# Patient Record
Sex: Male | Born: 1970
Health system: Southern US, Community
[De-identification: ages and names within clinical notes are randomized; demographics above are authoritative.]

---

## 2007-05-23 ENCOUNTER — Other Ambulatory Visit: Payer: Self-pay

## 2007-05-23 ENCOUNTER — Emergency Department: Payer: Self-pay | Admitting: Emergency Medicine

## 2012-04-06 ENCOUNTER — Emergency Department: Payer: Self-pay | Admitting: Emergency Medicine

## 2014-04-25 ENCOUNTER — Encounter: Payer: Self-pay | Admitting: *Deleted

## 2014-05-01 ENCOUNTER — Emergency Department
Admit: 2014-05-01 | Disposition: A | Discharge status: Home / Self Care | Payer: Self-pay | Admitting: Emergency Medicine

## 2014-05-03 ENCOUNTER — Inpatient Hospital Stay
Admit: 2014-05-03 | Disposition: A | Discharge status: Home / Self Care | Payer: Self-pay | Attending: Internal Medicine | Admitting: Internal Medicine

## 2014-05-03 LAB — COMPREHENSIVE METABOLIC PANEL
ALBUMIN: 4.3 g/dL
ALK PHOS: 62 U/L
ALT: 14 U/L — AB
ANION GAP: 10 (ref 7–16)
BUN: 13 mg/dL
Bilirubin,Total: 0.7 mg/dL
CALCIUM: 9.6 mg/dL
CHLORIDE: 99 mmol/L — AB
Co2: 28 mmol/L
Creatinine: 1 mg/dL
EGFR (African American): >60
GLUCOSE: 111 mg/dL — AB
Potassium: 4.3 mmol/L
SGOT(AST): 20 U/L
SODIUM: 137 mmol/L
Total Protein: 8.1 g/dL

## 2014-05-03 LAB — CBC WITH DIFFERENTIAL/PLATELET
BASOS ABS: 0 10*3/uL (ref 0.0–0.1)
Basophil %: 0.2 %
EOS ABS: 0.1 10*3/uL (ref 0.0–0.7)
EOS PCT: 0.7 %
HCT: 44.9 % (ref 40.0–52.0)
HGB: 14.9 g/dL (ref 13.0–18.0)
Lymphocyte #: 3.2 10*3/uL (ref 1.0–3.6)
Lymphocyte %: 17.6 %
MCH: 31.6 pg (ref 26.0–34.0)
MCHC: 33.2 g/dL (ref 32.0–36.0)
MCV: 95 fL (ref 80–100)
MONOS PCT: 7.3 %
Monocyte #: 1.3 x10 3/mm — ABNORMAL HIGH (ref 0.2–1.0)
NEUTROS ABS: 13.5 10*3/uL — AB (ref 1.4–6.5)
Neutrophil %: 74.2 %
Platelet: 230 10*3/uL (ref 150–440)
RBC: 4.71 10*6/uL (ref 4.40–5.90)
RDW: 13.2 % (ref 11.5–14.5)
WBC: 18.3 10*3/uL — ABNORMAL HIGH (ref 3.8–10.6)

## 2014-05-08 LAB — CULTURE, BLOOD (SINGLE)

## 2014-05-21 NOTE — H&P (Signed)
PATIENT NAME:  Benjamin Hill, Benjamin Hill MR#:  [id number] DATE OF BIRTH:  January 27, 1970  DATE OF ADMISSION:  05/03/2014  REFERRING PHYSICIAN:  Darci Current, MD   PRIMARY CARE PHYSICIAN:  Nonlocal.   ADMISSION DIAGNOSIS: Gluteal abscess.   HISTORY OF PRESENT ILLNESS: This is a 44 year old Caucasian male who presents to the Emergency Department complaining of a painful wound on his right gluteal cheek. The patient was seen in the Emergency Department a few days ago for the same and he received some oral doxycycline that he has been taking but today felt mildly nauseous briefly as well as some sweating and chills. The lesion on his bottom has become slightly more painful as well which prompted him to come to the Emergency Department.   Laboratory evaluation there revealed insignificant leukocytosis and enlarged area of cellulitis and abscess which prompted the Emergency Department to call for admission.   REVIEW OF SYSTEMS:  CONSTITUTIONAL: Patient admits to subjective fever but denies weakness.  EYES:  Denies blurred vision or inflammation.  EARS, NOSE AND THROAT: Denies tinnitus or sore throat.  RESPIRATORY: Denies cough or shortness of breath.  CARDIOVASCULAR: Denies chest pain, palpitations, or paroxysmal nocturnal dyspnea.  GASTROINTESTINAL: Admits to 1 episode of nausea but denies vomiting, abdominal pain, or diarrhea.  GENITOURINARY: Denies dysuria, increased frequency, or hesitancy of urination.  ENDOCRINE: Denies polyuria or polydipsia.  HEMATOLOGIC AND LYMPHATIC: Denies easy bruising or bleeding.  MUSCULOSKELETAL: Denies arthralgias or myalgias.  ENDOCRINE: Denies polyuria or polydipsia.  NEUROLOGIC: Denies numbness in his extremities or dysarthria.  PSYCHIATRIC: Denies depression or suicidal ideation.   PAST MEDICAL HISTORY: The patient has no chronic medical illnesses.   PAST SURGICAL HISTORY: The patient has not undergone any surgeries.   SOCIAL HISTORY: The patient is married, has  children who are in good health. He is actively trying to quit smoking by using Chantix in the past. He does not drink or do any drugs.   FAMILY HISTORY: Significant for blood pressure in sporadic members in his family. One of his parents is  deceased of lung cancer and other battling the same condition right now.     MEDICATIONS: The patient currently takes no medications.   ALLERGIES: No known drug allergies.   PERTINENT LABORATORY RESULTS AND RADIOGRAPHIC FINDINGS: Serum glucose is 111, BUN 13, creatinine 1, sodium is 137, potassium 4.3, chloride is 99, bicarbonate is 28, calcium is 9.6, serum albumin is 4.3, bilirubin 0.7, alkaline phosphatase 62, AST 20, ALT is 14; white blood cell count is 18.3, hemoglobin is 14.9, hematocrit 44.9, platelet count 230,000; MCV is 95. Ultrasound results of soft tissue cellulitis and abscess, shows interstitial edema with suspected cellulitis without significant abscess.     PHYSICAL EXAMINATION: VITAL SIGNS: Temperature is 97.8, pulse is 86, respirations 20, blood pressure is 126/80; pulse oximetry is 98% on room air.  GENERAL: The patient is alert and oriented x 3, in no current distress.  HEENT: Normocephalic, atraumatic, pupils equal, round, and reactive to light and accommodation. Extraocular movements are intact. Mucous membranes are moist.  NECK: Trachea is midline. No adenopathy. Thyroid is nonpalpable, not tender.  CHEST: Symmetric and atraumatic.  CARDIOVASCULAR: Regular rate and rhythm, normal S1, S2, no rubs, clicks, or murmurs appreciated.  LUNGS: Clear to auscultation bilaterally, normal effort and excursion. ABDOMEN: Positive bowel sounds, soft, nontender, nondistended, no hepatosplenomegaly.    GENITOURINARY: Deferred.  MUSCULOSKELETAL: The patient moves all 4 extremities equally; there is 5 out of 5 strength in upper and lower extremities  bilaterally.  SKIN: Warm and dry. The patient has an area of approximately 3.5 cm in diameter on his  right lateral gluteus with a small puncta in the middle that is hot and tender to touch, it is nonfluctuant.  EXTREMITIES: No clubbing, cyanosis, or edema.  NEUROLOGIC: Cranial nerves II through XII are grossly intact.  PSYCHIATRIC: Mood is normal. Affect is congruent. The patient has excellent insight and judgment into his medical condition.   ASSESSMENT AND PLAN: This is a 44 year old male admitted for gluteal cellulitis and early abscess.  1.  Abscess is not yet fluctuant.  Plan is to apply warm compresses. The patient has been given vancomycin the Emergency Department. We will continue to follow sensitivities from blood cultures obtained.  2.  Sepsis. The patient meets criteria via heart rate and leukocytosis. He is hemodynamically stable and we will follow blood culture results.  3.  Deep vein thrombosis prophylaxis, Lovenox.  4.  Gastrointestinal prophylaxis, unnecessary as the patient is not critically ill.   CODE STATUS: The patient is a full code.   Time spent on admission orders and patient care is approximately 35 minutes.    ____________________________ Kelton PillarMichael S. Sheryle Hailiamond, MD msd:nt D: 05/03/2014 17:33:14 ET T: 05/03/2014 18:44:14 ET JOB#: [id number]  cc: Kelton PillarMichael S. Sheryle Hailiamond, MD, <Dictator> Kelton PillarMICHAEL S Johncharles Fusselman MD ELECTRONICALLY SIGNED 05/10/2014 9:45

## 2014-05-21 NOTE — Discharge Summary (Signed)
PATIENT NAME:  Benjamin Hill, Richad A MR#:  [id number] DATE OF BIRTH:  1970-09-09  DATE OF ADMISSION:  05/03/2014 DATE OF DISCHARGE:  05/04/2014  PRESENTING COMPLAINT: Increased redness, erythema and pain on the right thigh.  DISCHARGE DIAGNOSIS: Right gluteus/thigh cellulitis.   CODE STATUS: FULL.  DISCHARGE MEDICATIONS: 1.  Percocet 5/325 one every 8 hours as needed.  2.  Bactrim DS 1 tablet b.i.d.   DISCHARGE INSTRUCTIONS: Follow up with urgent care or ER as needed.   DIAGNOSTIC DATA: Blood cultures negative in 48 hours.   Ultrasound, soft tissue, showed interstitial edema, suspect cellulitis without abscess.  White count 18,000. Chemistry within normal limits.   BRIEF SUMMARY OF HOSPITAL COURSE: Rod MaeCharles Florendo is a 44 year old Caucasian gentleman with no past medical history who comes to the Emergency Room with increasing right gluteal erythema. He was admitted with:  1.  Right gluteal/thigh erythema due to cellulitis. No evidence of abscess. IV antibiotics with vancomycin and Unasyn was started, changed to p.o. Bactrim DS b.i.d.  2.  Sepsis. Due to #1. Resolved. Blood cultures negative.   Overall hospital stay otherwise remained stable. The patient remained a FULL code.   TIME SPENT: 40 minutes.  ____________________________ Wylie HailSona A. Allena KatzPatel, MD sap:sb D: 05/05/2014 07:03:31 ET T: 05/05/2014 12:31:19 ET JOB#: [id number]  cc: Tomislav Micale A. Allena KatzPatel, MD, <Dictator> Willow OraSONA A Harsimran Westman MD ELECTRONICALLY SIGNED 05/10/2014 10:33

## 2016-10-11 IMAGING — US US SOFT TISSUE EXCLUDE HEAD/NECK
1 series · 14 of 25 positions shown · non-contrast
Comparison: None.

CLINICAL DATA: Cellulitis, concern for abscess. Symptoms began
after a pinching sensation 5 days ago.

EXAM:
US SOFT TISSUE EXCLUDE HEAD/NECK
TECHNIQUE: Ultrasound examination of the gluteal soft tissues was performed in
the area of clinical concern.

[Series 1: us soft tissue exclude head/neck · 0.06mm/px · 14 of 58 slices shown]
[im 1/58]
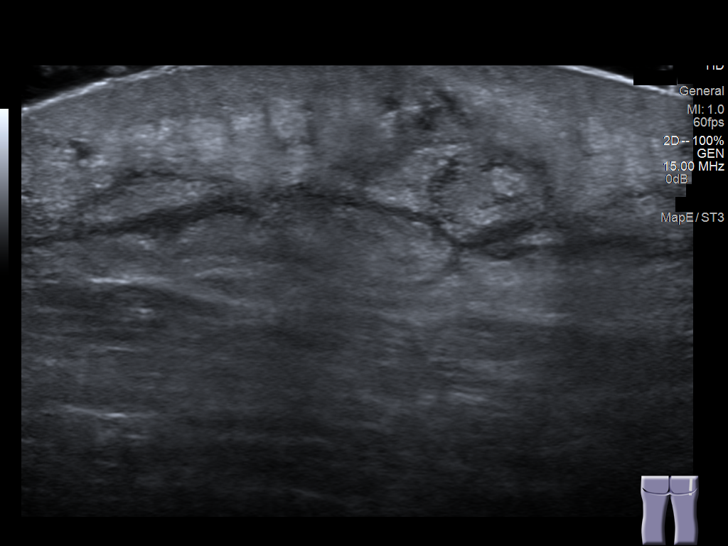
[im 5/58]
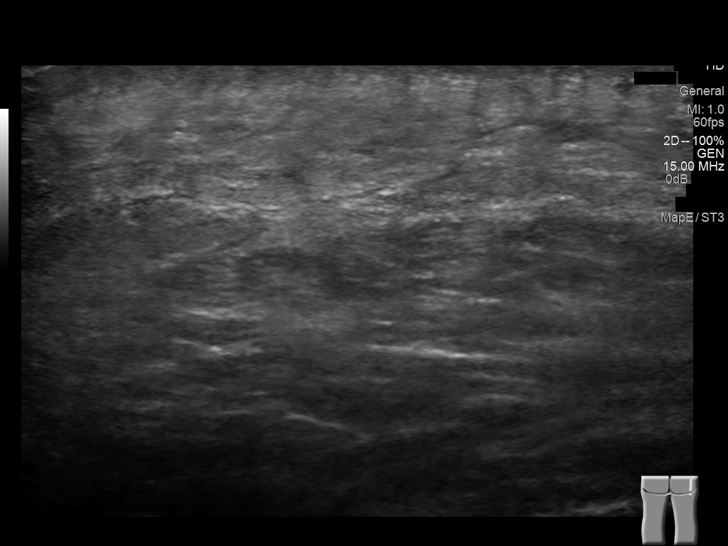
[im 10/58]
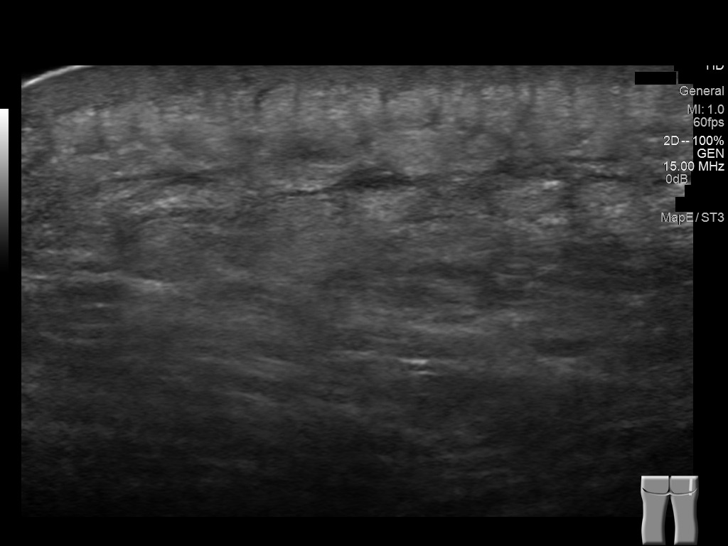
[im 15/58]
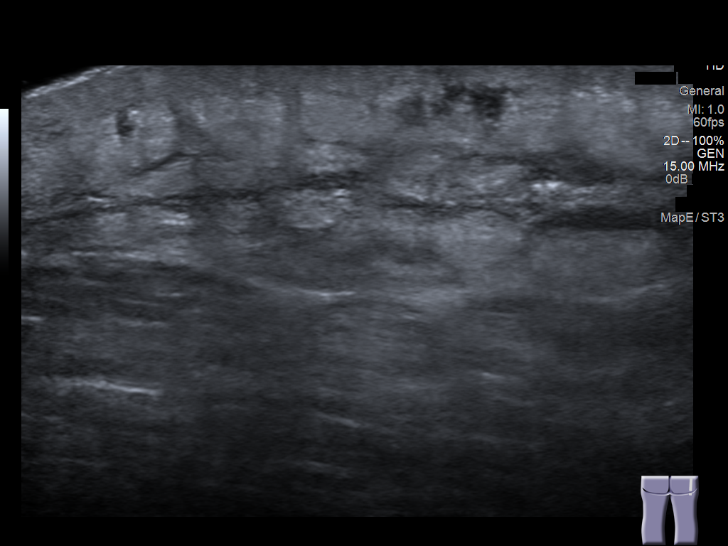
[im 20/58]
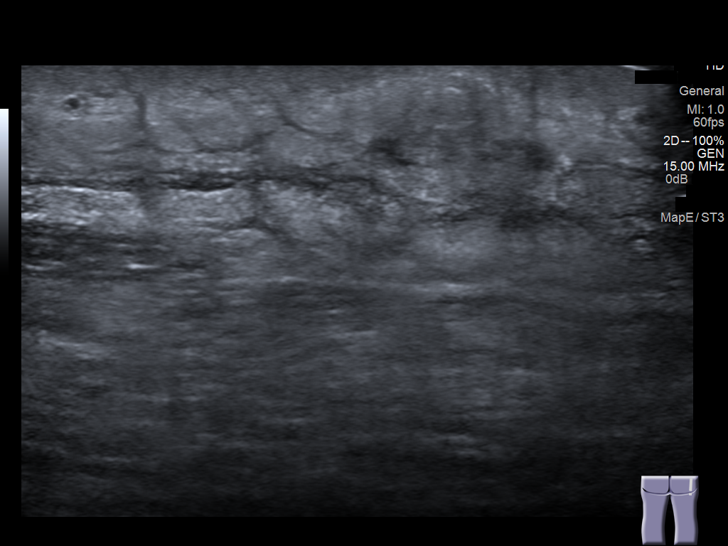
[im 22/58]
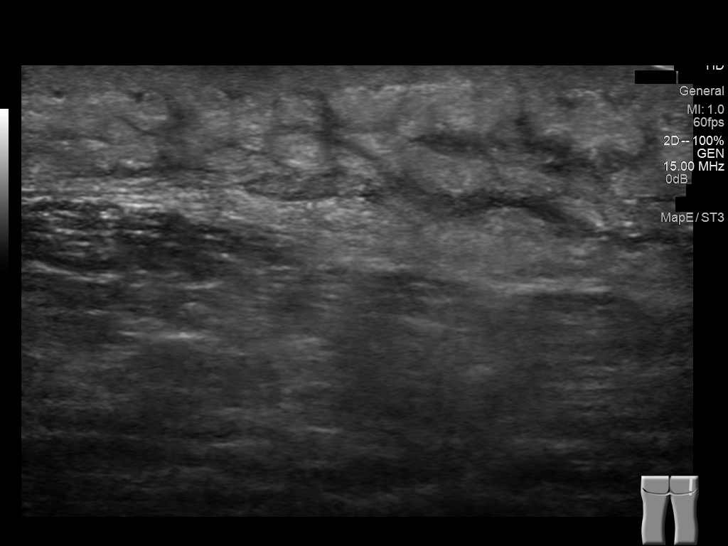
[im 27/58]
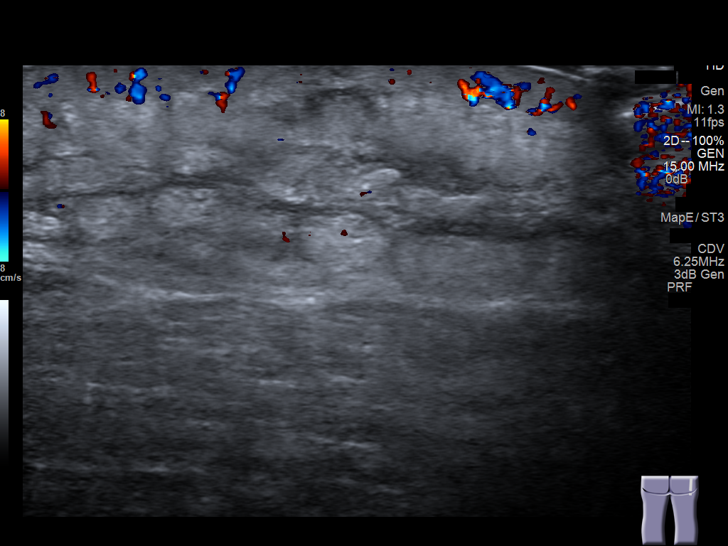
[im 31/58]
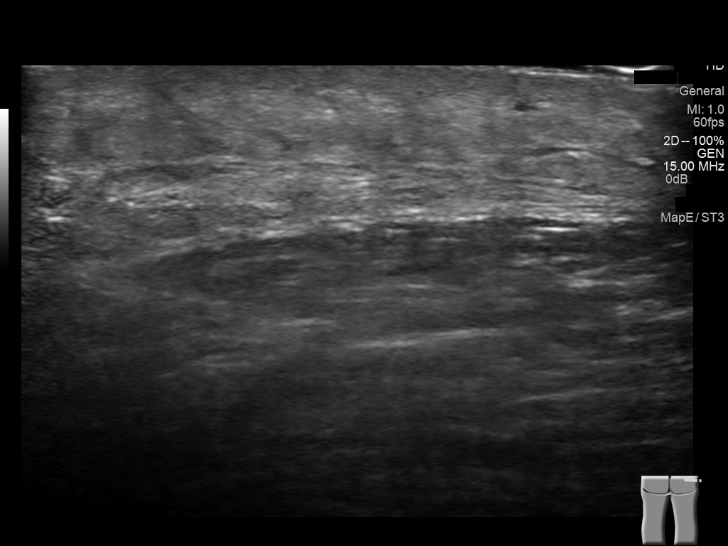
[im 36/58]
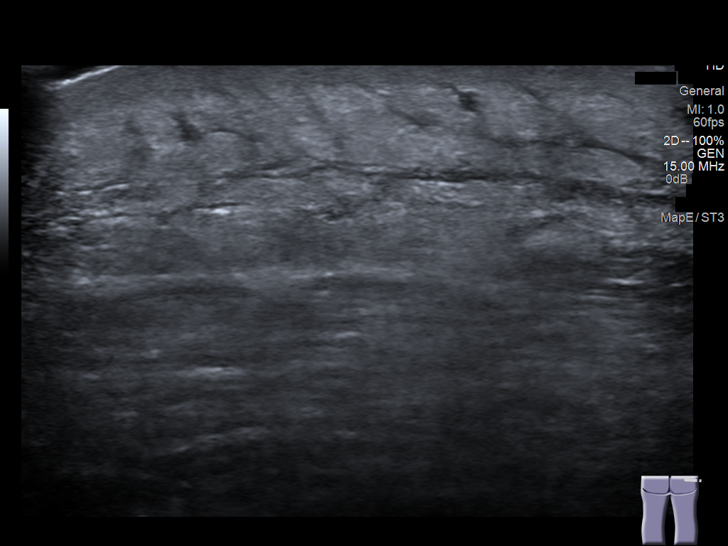
[im 39/58]
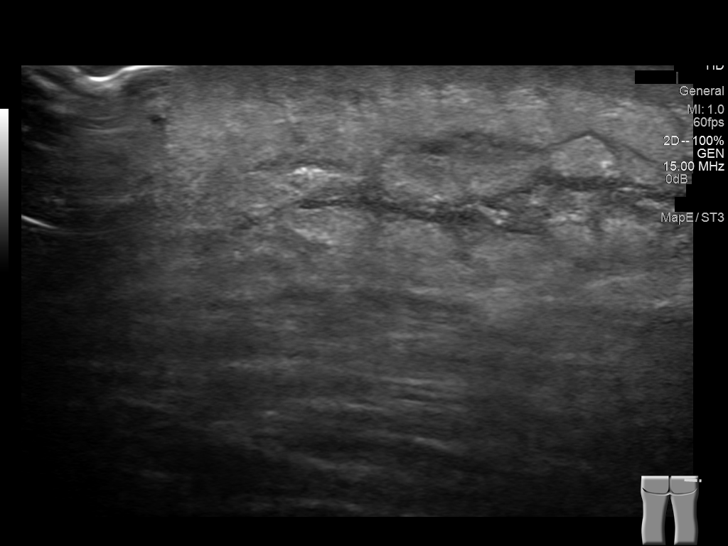
[im 43/58]
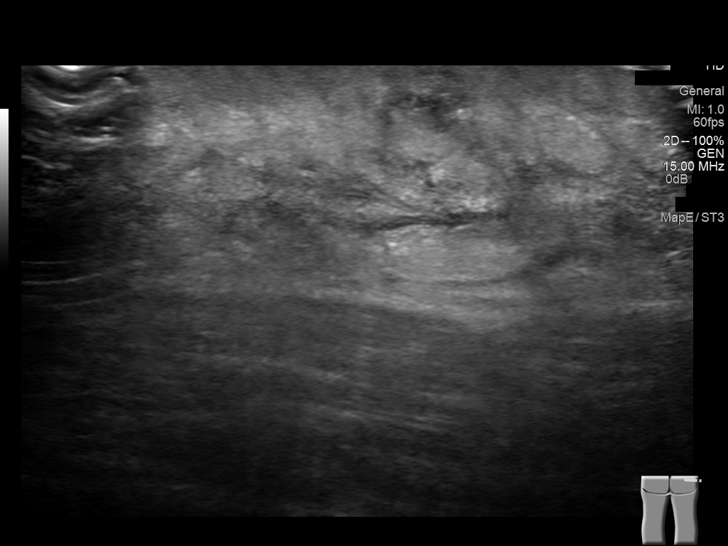
[im 48/58]
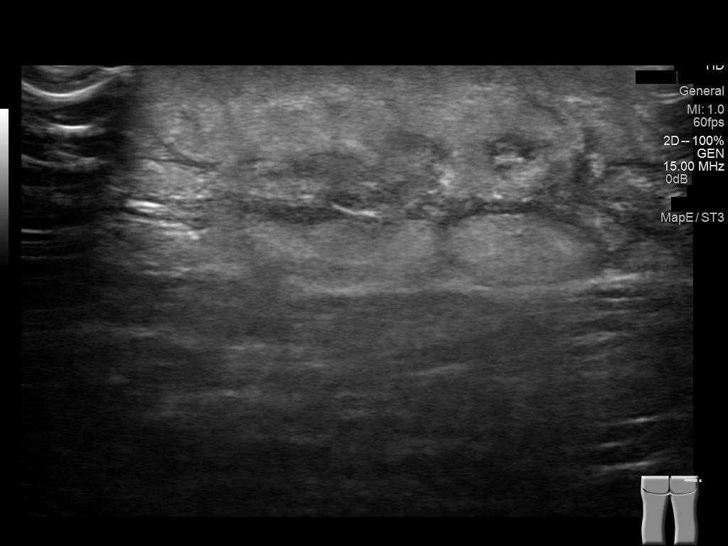
[im 53/58]
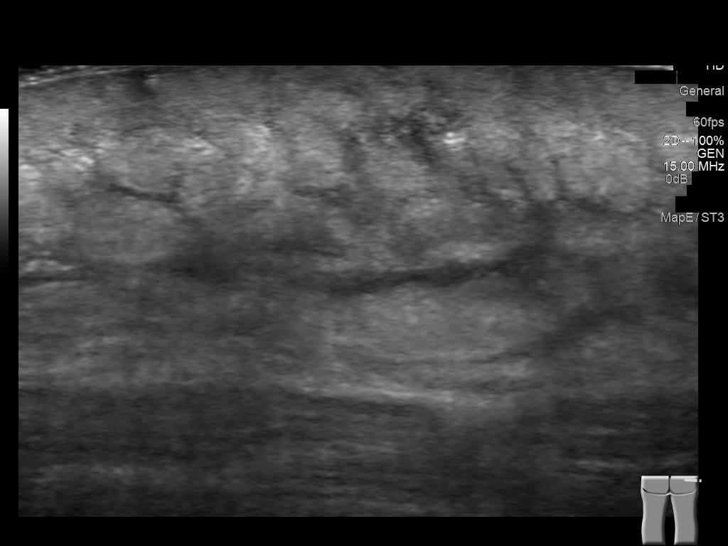
[im 58/58]
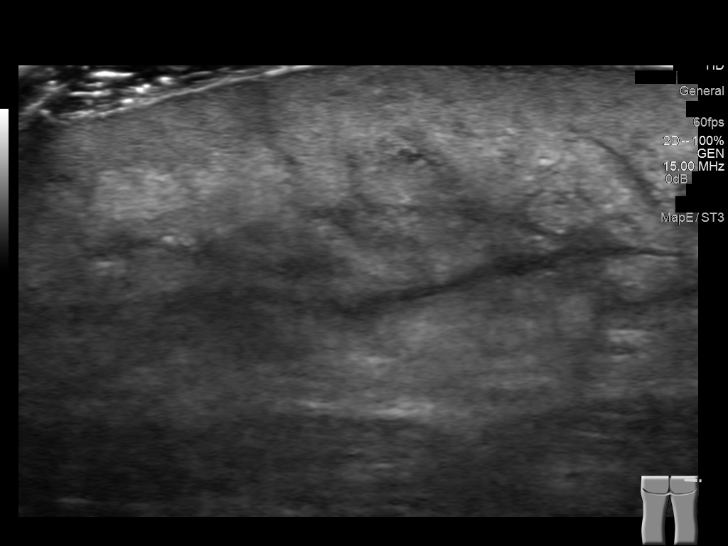

[14 of 25 positions shown; findings below may reference images not displayed]

FINDINGS: Interstitial edema within the RIGHT gluteal soft tissues in area of
concern without focal fluid collection. Mildly increased
vascularity. No echogenic foci to suggest radiopaque foreign bodies
or gas.
IMPRESSION: Interstitial edema/ suspected cellulitis without abscess.

By: Luna Aidoo

## 2018-03-30 ENCOUNTER — Encounter (HOSPITAL_COMMUNITY): Payer: Self-pay | Admitting: *Deleted

## 2018-03-30 ENCOUNTER — Emergency Department (HOSPITAL_COMMUNITY)
Admission: EM | Admit: 2018-03-30 | Discharge: 2018-03-30 | Disposition: A | Discharge status: Home / Self Care | Payer: No Typology Code available for payment source | Attending: Emergency Medicine | Admitting: Emergency Medicine

## 2018-03-30 ENCOUNTER — Other Ambulatory Visit: Payer: Self-pay

## 2018-03-30 DIAGNOSIS — F332 Major depressive disorder, recurrent severe without psychotic features: Secondary | ICD-10-CM | POA: Insufficient documentation

## 2018-03-30 DIAGNOSIS — F320 Major depressive disorder, single episode, mild: Secondary | ICD-10-CM

## 2018-03-30 DIAGNOSIS — F17210 Nicotine dependence, cigarettes, uncomplicated: Secondary | ICD-10-CM | POA: Diagnosis not present

## 2018-03-30 LAB — COMPREHENSIVE METABOLIC PANEL
ALT: 14 U/L (ref 0–44)
AST: 21 U/L (ref 15–41)
Albumin: 4.6 g/dL (ref 3.5–5.0)
Alkaline Phosphatase: 61 U/L (ref 38–126)
Anion gap: 11 (ref 5–15)
BUN: 11 mg/dL (ref 6–20)
CO2: 23 mmol/L (ref 22–32)
Calcium: 9.5 mg/dL (ref 8.9–10.3)
Chloride: 103 mmol/L (ref 98–111)
Creatinine, Ser: 0.85 mg/dL (ref 0.61–1.24)
GFR calc Af Amer: >60 mL/min (ref >60)
GFR calc non Af Amer: >60 mL/min (ref >60)
GLUCOSE: 82 mg/dL (ref 70–99)
POTASSIUM: 3.7 mmol/L (ref 3.5–5.1)
Sodium: 137 mmol/L (ref 135–145)
Total Bilirubin: 0.5 mg/dL (ref 0.3–1.2)
Total Protein: 8.3 g/dL — ABNORMAL HIGH (ref 6.5–8.1)

## 2018-03-30 LAB — CBC WITH DIFFERENTIAL/PLATELET
Abs Immature Granulocytes: 0.03 10*3/uL (ref 0.00–0.07)
Basophils Absolute: 0 10*3/uL (ref 0.0–0.1)
Basophils Relative: 0 %
EOS ABS: 0.2 10*3/uL (ref 0.0–0.5)
Eosinophils Relative: 2 %
HEMATOCRIT: 45 % (ref 39.0–52.0)
Hemoglobin: 14.7 g/dL (ref 13.0–17.0)
IMMATURE GRANULOCYTES: 0 %
LYMPHS ABS: 3.7 10*3/uL (ref 0.7–4.0)
Lymphocytes Relative: 35 %
MCH: 31.1 pg (ref 26.0–34.0)
MCHC: 32.7 g/dL (ref 30.0–36.0)
MCV: 95.1 fL (ref 80.0–100.0)
Monocytes Absolute: 0.7 10*3/uL (ref 0.1–1.0)
Monocytes Relative: 6 %
NEUTROS PCT: 57 %
NRBC: 0 % (ref 0.0–0.2)
Neutro Abs: 6.1 10*3/uL (ref 1.7–7.7)
Platelets: 331 10*3/uL (ref 150–400)
RBC: 4.73 MIL/uL (ref 4.22–5.81)
RDW: 13.5 % (ref 11.5–15.5)
WBC: 10.8 10*3/uL — ABNORMAL HIGH (ref 4.0–10.5)

## 2018-03-30 LAB — ETHANOL: Alcohol, Ethyl (B): <10 mg/dL (ref <10)

## 2018-03-30 MED ORDER — AMITRIPTYLINE HCL 50 MG PO TABS: ORAL_TABLET | ORAL | 1 refills | Status: AC

## 2018-03-30 NOTE — ED Triage Notes (Signed)
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 Pt denies any SI/HI just states he feels depressed; pt states he is having trouble sleeping and eating
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
## 2018-03-30 NOTE — ED Provider Notes (Addendum)
[facility] EMERGENCY DEPARTMENT Provider Note   CSN: [id number] Arrival date & time: 03/30/18  1902    History   Chief Complaint Chief Complaint  Patient presents with  . Medical Clearance    HPI Benjamin Hill. is a 48 y.o. male.     Patient presents stating that he feels very stressed depressed and anxious.  He has not been sleeping or eating much for over a week.  The history is provided by the patient. No language interpreter was used.  Altered Mental Status  Presenting symptoms: no disorientation   Severity:  Moderate Most recent episode:  More than 2 days ago Episode history:  Single Timing:  Constant Progression:  Worsening Chronicity:  New Context: not alcohol use   Associated symptoms: no abdominal pain, no hallucinations, no headaches, no rash and no seizures     History reviewed. No pertinent past medical history.  There are no active problems to display for this patient.   History reviewed. No pertinent surgical history.      Home Medications    Prior to Admission medications   Not on File    Family History History reviewed. No pertinent family history.  Social History Social History   Tobacco Use  . Smoking status: Current Every Day Smoker    Packs/day: 1.00    Types: Cigarettes  . Smokeless tobacco: Never Used  Substance Use Topics  . Alcohol use: Not Currently  . Drug use: Not Currently     Allergies   Patient has no known allergies.   Review of Systems Review of Systems  Constitutional: Negative for appetite change and fatigue.  HENT: Negative for congestion, ear discharge and sinus pressure.   Eyes: Negative for discharge.  Respiratory: Negative for cough.   Cardiovascular: Negative for chest pain.  Gastrointestinal: Negative for abdominal pain and diarrhea.  Genitourinary: Negative for frequency and hematuria.  Musculoskeletal: Negative for back pain.  Skin: Negative for rash.  Neurological: Negative for  seizures and headaches.  Psychiatric/Behavioral: Positive for dysphoric mood. Negative for hallucinations.     Physical Exam Updated Vital Signs BP (!) 149/93 (BP Location: Right Arm)   Pulse 82   Temp 98.3 F (36.8 C) (Oral)   Resp 17   Ht 5\' 10"  (1.778 m)   Wt 68 kg   SpO2 97%   BMI 21.52 kg/m   Physical Exam Vitals signs and nursing note reviewed.  Constitutional:      Appearance: He is well-developed.  HENT:     Head: Normocephalic.     Nose: Nose normal.  Eyes:     General: No scleral icterus.    Conjunctiva/sclera: Conjunctivae normal.  Neck:     Musculoskeletal: Neck supple.     Thyroid: No thyromegaly.  Cardiovascular:     Rate and Rhythm: Normal rate and regular rhythm.     Heart sounds: No murmur. No friction rub. No gallop.   Pulmonary:     Breath sounds: No stridor. No wheezing or rales.  Chest:     Chest wall: No tenderness.  Abdominal:     General: There is no distension.     Tenderness: There is no abdominal tenderness. There is no rebound.  Musculoskeletal: Normal range of motion.  Lymphadenopathy:     Cervical: No cervical adenopathy.  Skin:    Findings: No erythema or rash.  Neurological:     Mental Status: He is oriented to person, place, and time.     Motor: No abnormal  muscle tone.     Coordination: Coordination normal.  Psychiatric:     Comments: Patient depressed anxious but not suicidal or homicidal      ED Treatments / Results  Labs (all labs ordered are listed, but only abnormal results are displayed) Labs Reviewed  CBC WITH DIFFERENTIAL/PLATELET  COMPREHENSIVE METABOLIC PANEL  ETHANOL  RAPID URINE DRUG SCREEN, HOSP PERFORMED    EKG None  Radiology No results found.  Procedures Procedures (including critical care time)  Medications Ordered in ED Medications - No data to display   Initial Impression / Assessment and Plan / ED Course  I have reviewed the triage vital signs and the nursing notes.  Pertinent labs  & imaging results that were available during my care of the patient were reviewed by me and considered in my medical decision making (see chart for details).   Patient depressed and anxious he will be evaluated by behavioral health   Patient was seen by behavioral health and it was felt he could be treated as an outpatient for his depression.  He will be given some Elavil to help with his sleep and depression and follow-up with outpatient treatment  Final Clinical Impressions(s) / ED Diagnoses   Final diagnoses:  None    ED Discharge Orders    None       Bethann Berkshire, MD 03/30/18 2014    Bethann Berkshire, MD 03/30/18 2313

## 2018-03-30 NOTE — Discharge Instructions (Signed)
Follow up with out patient behavior health as instructed by the behavior health person

## 2018-03-30 NOTE — ED Notes (Signed)
Updated pt on delay of waiting on outpatient information

## 2018-04-15 NOTE — Progress Notes (Deleted)
Psychiatric Initial Adult Assessment   Patient Identification: Benjamin Hill. MRN:  [id number] Date of Evaluation:  04/15/2018 Referral Source: *** Chief Complaint:   Visit Diagnosis: No diagnosis found.  History of Present Illness:   Benjamin Huyett. is a 48 y.o. year old male with a history of depression, who is referred for   amitriptyline   Associated Signs/Symptoms: Depression Symptoms:  {DEPRESSION SYMPTOMS:20000} (Hypo) Manic Symptoms:  {BHH MANIC SYMPTOMS:22872} Anxiety Symptoms:  {BHH ANXIETY SYMPTOMS:22873} Psychotic Symptoms:  {BHH PSYCHOTIC SYMPTOMS:22874} PTSD Symptoms: {BHH PTSD SYMPTOMS:22875}  Past Psychiatric History:  Outpatient:  Psychiatry admission:  Previous suicide attempt:  Past trials of medication:  History of violence:   Previous Psychotropic Medications: {YES/NO:21197}  Substance Abuse History in the last 12 months:  {yes no:314532}  Consequences of Substance Abuse: {BHH CONSEQUENCES OF SUBSTANCE ABUSE:22880}  Past Medical History: No past medical history on file. No past surgical history on file.  Family Psychiatric History: ***  Family History: No family history on file.  Social History:   Social History   Socioeconomic History  . Marital status: Married    Spouse name: Not on file  . Number of children: Not on file  . Years of education: Not on file  . Highest education level: Not on file  Occupational History  . Not on file  Social Needs  . Financial resource strain: Not on file  . Food insecurity:    Worry: Not on file    Inability: Not on file  . Transportation needs:    Medical: Not on file    Non-medical: Not on file  Tobacco Use  . Smoking status: Current Every Day Smoker    Packs/day: 1.00    Types: Cigarettes  . Smokeless tobacco: Never Used  Substance and Sexual Activity  . Alcohol use: Not Currently  . Drug use: Not Currently  . Sexual activity: Not on file  Lifestyle  . Physical  activity:    Days per week: Not on file    Minutes per session: Not on file  . Stress: Not on file  Relationships  . Social connections:    Talks on phone: Not on file    Gets together: Not on file    Attends religious service: Not on file    Active member of club or organization: Not on file    Attends meetings of clubs or organizations: Not on file    Relationship status: Not on file  Other Topics Concern  . Not on file  Social History Narrative  . Not on file    Additional Social History: ***  Allergies:  No Known Allergies  Metabolic Disorder Labs: No results found for: HGBA1C, MPG No results found for: PROLACTIN No results found for: CHOL, TRIG, HDL, CHOLHDL, VLDL, LDLCALC No results found for: TSH  Therapeutic Level Labs: No results found for: LITHIUM No results found for: CBMZ No results found for: VALPROATE  Current Medications: Current Outpatient Medications  Medication Sig Dispense Refill  . amitriptyline (ELAVIL) 50 MG tablet Take one po qhs 30 tablet 1   No current facility-administered medications for this visit.     Musculoskeletal: Strength & Muscle Tone: within normal limits Gait & Station: normal Patient leans: N/A  Psychiatric Specialty Exam: ROS  There were no vitals taken for this visit.There is no height or weight on file to calculate BMI.  General Appearance: Fairly Groomed  Eye Contact:  Good  Speech:  Clear and Coherent  Volume:  Normal  Mood:  {BHH MOOD:22306}  Affect:  {Affect (PAA):22687}  Thought Process:  Coherent  Orientation:  Full (Time, Place, and Person)  Thought Content:  Logical  Suicidal Thoughts:  {ST/HT (PAA):22692}  Homicidal Thoughts:  {ST/HT (PAA):22692}  Memory:  Immediate;   Good  Judgement:  {Judgement (PAA):22694}  Insight:  {Insight (PAA):22695}  Psychomotor Activity:  Normal  Concentration:  Concentration: Good and Attention Span: Good  Recall:  Good  Fund of Knowledge:Good  Language: Good  Akathisia:   No  Handed:  Right  AIMS (if indicated):  not done  Assets:  Communication Skills Desire for Improvement  ADL's:  Intact  Cognition: WNL  Sleep:  {BHH GOOD/FAIR/POOR:22877}   Screenings:   Assessment and Plan:  Assessment  Plan  The patient demonstrates the following risk factors for suicide: Chronic risk factors for suicide include: {Chronic Risk Factors for KDXIPJA:25053976}. Acute risk factors for suicide include: {Acute Risk Factors for BHALPFX:90240973}. Protective factors for this patient include: {Protective Factors for Suicide ZHGD:92426834}. Considering these factors, the overall suicide risk at this point appears to be {Desc; low/moderate/high:110033}. Patient {ACTION; IS/IS HDQ:22297989} appropriate for outpatient follow up.     Neysa Hotter, MD 3/26/202011:35 AM

## 2018-04-21 ENCOUNTER — Ambulatory Visit (HOSPITAL_COMMUNITY): Payer: No Typology Code available for payment source | Admitting: Psychiatry

## 2018-04-21 ENCOUNTER — Other Ambulatory Visit: Payer: Self-pay

## 2018-04-21 ENCOUNTER — Encounter

## 2019-03-01 ENCOUNTER — Ambulatory Visit (INDEPENDENT_AMBULATORY_CARE_PROVIDER_SITE_OTHER): Payer: No Typology Code available for payment source

## 2019-03-01 ENCOUNTER — Ambulatory Visit
Admission: EM | Admit: 2019-03-01 | Discharge: 2019-03-01 | Disposition: A | Discharge status: Home / Self Care | Payer: No Typology Code available for payment source | Attending: Emergency Medicine | Admitting: Emergency Medicine

## 2019-03-01 ENCOUNTER — Other Ambulatory Visit: Payer: Self-pay

## 2019-03-01 DIAGNOSIS — Z87828 Personal history of other (healed) physical injury and trauma: Secondary | ICD-10-CM

## 2019-03-01 DIAGNOSIS — S4990XA Unspecified injury of shoulder and upper arm, unspecified arm, initial encounter: Secondary | ICD-10-CM | POA: Diagnosis not present

## 2019-03-01 DIAGNOSIS — M25512 Pain in left shoulder: Secondary | ICD-10-CM | POA: Diagnosis not present

## 2019-03-01 MED ORDER — PREDNISONE 10 MG PO TABS: 20.0000 mg | ORAL_TABLET | Freq: Every day | ORAL | 0 refills | Status: AC

## 2019-03-01 MED ORDER — IBUPROFEN 800 MG PO TABS: 800.0000 mg | ORAL_TABLET | Freq: Three times a day (TID) | ORAL | 0 refills | Status: AC

## 2019-03-01 NOTE — Discharge Instructions (Addendum)
Rest, ice and heat as needed Ensure adequate ROM as tolerated. Prescribed ibuprofen as needed for inflammation and pain relief Prescribed Short-term prednisone Follow-up with primary care for referral to orthopedic Return here or go to ER if you have any new or worsening symptoms such as numbness/tingling of the inner thighs, loss of bladder or bowel control, headache/blurry vision, nausea/vomiting, confusion/altered mental status, dizziness, weakness, passing out, imbalance, etc..Marland Kitchen

## 2019-03-01 NOTE — ED Triage Notes (Signed)
Pt states that he was helping his son move furniture today. His son dropped his side of the couch leaving him being the only one holding it. The patient states he felt a lot of pain in his left shoulder when that happened. Reports previous dislocation in this shoulder 25 years ago. Reports increased pain with movement.

## 2019-03-01 NOTE — ED Provider Notes (Signed)
[facility]    CSN: [id number] Arrival date & time: 03/01/19  1627      History   Chief Complaint Chief Complaint  Patient presents with  . Shoulder Injury    HPI Benjamin Hill. is a 49 y.o. male.   Who presents to the urgent care with a complaint of left shoulder pain that started today.  Patient reports a precipitating event.  He was moving furniture with her son and injured his left shoulder in the process.  He localizes the pain to the left shoulder.  He describes the pain as constant and achy and rated at 5 on a scale of 1-10. Pain is worse  on a range of motion (ROM).  Report he has not tried any medication.  His symptoms are made worse with movement.  Reports similar symptoms in the past.   The history is provided by the patient. No language interpreter was used.  Shoulder Injury    History reviewed. No pertinent past medical history.  There are no problems to display for this patient.   History reviewed. No pertinent surgical history.     Home Medications    Prior to Admission medications   Medication Sig Start Date End Date Taking? Authorizing Provider  amitriptyline (ELAVIL) 50 MG tablet Take one po qhs 03/30/18   Bethann Berkshire, MD  ibuprofen (ADVIL) 800 MG tablet Take 1 tablet (800 mg total) by mouth 3 (three) times daily. 03/01/19   Arda Daggs, Zachery Dakins, FNP  predniSONE (DELTASONE) 10 MG tablet Take 2 tablets (20 mg total) by mouth daily. 03/01/19   AvegnoZachery Dakins, FNP    Family History Family History  Problem Relation Age of Onset  . Cancer Mother   . Heart attack Mother   . Hypertension Mother   . Cancer Father     Social History Social History   Tobacco Use  . Smoking status: Current Every Day Smoker    Packs/day: 1.00    Types: Cigarettes  . Smokeless tobacco: Never Used  Substance Use Topics  . Alcohol use: Not Currently  . Drug use: Not Currently     Allergies   Patient has no known allergies.   Review of  Systems Review of Systems  Constitutional: Negative.   Respiratory: Negative.   Cardiovascular: Negative.   Musculoskeletal: Negative for joint swelling.       Shoulder pain     Physical Exam Triage Vital Signs ED Triage Vitals  Enc Vitals Group     BP 03/01/19 1635 112/75     Pulse Rate 03/01/19 1635 75     Resp 03/01/19 1635 17     Temp 03/01/19 1635 98.5 F (36.9 C)     Temp Source 03/01/19 1635 Oral     SpO2 03/01/19 1635 96 %     Weight --      Height --      Head Circumference --      Peak Flow --      Pain Score 03/01/19 1641 6     Pain Loc --      Pain Edu? --      Excl. in GC? --    No data found.  Updated Vital Signs BP 112/75 (BP Location: Right Arm)   Pulse 75   Temp 98.5 F (36.9 C) (Oral)   Resp 17   SpO2 96%   Visual Acuity Right Eye Distance:   Left Eye Distance:   Bilateral Distance:  Right Eye Near:   Left Eye Near:    Bilateral Near:     Physical Exam Constitutional:      General: He is not in acute distress.    Appearance: Normal appearance. He is normal weight. He is not toxic-appearing or diaphoretic.  Cardiovascular:     Rate and Rhythm: Normal rate and regular rhythm.     Pulses: Normal pulses.     Heart sounds: Normal heart sounds. No murmur.  Pulmonary:     Effort: Pulmonary effort is normal. No respiratory distress.     Breath sounds: Normal breath sounds. No stridor. No wheezing, rhonchi or rales.  Chest:     Chest wall: No tenderness.  Musculoskeletal:        General: Tenderness present. No swelling or deformity. Normal range of motion.     Right shoulder: Normal.     Left shoulder: Tenderness present. No swelling, deformity, effusion or laceration. Normal range of motion.     Right lower leg: No edema.     Left lower leg: No edema.     Comments: The right shoulder is without obvious asymmetry or deformity when compared to the left shoulder.  No surface trauma ecchymosis crepitus.  No bony deformity or prominence of  the humeral head.  No erythema or warmth swelling.  Normal sensation.  Neurological:     Mental Status: He is alert.      UC Treatments / Results  Labs (all labs ordered are listed, but only abnormal results are displayed) Labs Reviewed - No data to display  EKG   Radiology DG Shoulder Left  Result Date: 03/01/2019 CLINICAL DATA:  49 year old male with sudden onset left shoulder pain while lifting heavy furniture or earlier today. History of prior acromioclavicular joint injury 20 years ago. EXAM: LEFT SHOULDER - 2+ VIEW COMPARISON:  None. FINDINGS: Cephalad displacement of the distal clavicle with respect to the acromion process. The distal clavicle is displaced superiorly by approximately 1 cm. There is evidence of a soft tissue contour abnormality on the scapular Y-view. Additionally, chronic changes are evident inferior to the distal clavicular head and in the region of the insertion of the coracoclavicular ligament suggesting prior grade 3 acromioclavicular joint injury. No acute fracture evident on today's examination. IMPRESSION: Cephalad displacement of the distal clavicle with respect to the acromion process suggests acute on chronic acromioclavicular joint injury. Electronically Signed   By: Jacqulynn Cadet M.D.   On: 03/01/2019 17:13    Procedures Procedures (including critical care time)  Medications Ordered in UC Medications - No data to display  Initial Impression / Assessment and Plan / UC Course  I have reviewed the triage vital signs and the nursing notes.  Pertinent labs & imaging results that were available during my care of the patient were reviewed by me and considered in my medical decision making (see chart for details).  Acromioclavicular joint injury, initial encounter  Left shoulder x-ray was ordered and result was reviewed.  Results revealed a displacement of the distal clavicle suggesting an acute or chronic acromioclavicular joint injury.  I have  reviewed the x-ray myself and the radiologist interpretation.  I am in agreement with the radiologist interpretation.  Ibuprofen 800 mg was prescribed Short-term prednisone was prescribed Work note was given Arm sling was applied Advised patient to follow-up with primary care to get a referral to orthopedic   Final Clinical Impressions(s) / UC Diagnoses   Final diagnoses:  Acromioclavicular joint injury, initial encounter  Discharge Instructions     Rest, ice and heat as needed Ensure adequate ROM as tolerated. Prescribed ibuprofen as needed for inflammation and pain relief Prescribed Short-term prednisone Return here or go to ER if you have any new or worsening symptoms such as numbness/tingling of the inner thighs, loss of bladder or bowel control, headache/blurry vision, nausea/vomiting, confusion/altered mental status, dizziness, weakness, passing out, imbalance, etc...      ED Prescriptions    Medication Sig Dispense Auth. Provider   ibuprofen (ADVIL) 800 MG tablet Take 1 tablet (800 mg total) by mouth 3 (three) times daily. 42 tablet Catrena Vari S, FNP   predniSONE (DELTASONE) 10 MG tablet Take 2 tablets (20 mg total) by mouth daily. 15 tablet Mimie Goering, Zachery Dakins, FNP     PDMP not reviewed this encounter.   Durward Parcel, FNP 03/01/19 [phone]

## 2019-08-19 ENCOUNTER — Emergency Department (HOSPITAL_COMMUNITY)
Admission: EM | Admit: 2019-08-19 | Discharge: 2019-08-22 | Disposition: A | Discharge status: Home / Self Care | Payer: No Typology Code available for payment source | Attending: Emergency Medicine | Admitting: Emergency Medicine

## 2019-08-19 ENCOUNTER — Encounter (HOSPITAL_COMMUNITY): Payer: Self-pay | Admitting: *Deleted

## 2019-08-19 DIAGNOSIS — F309 Manic episode, unspecified: Secondary | ICD-10-CM | POA: Diagnosis not present

## 2019-08-19 DIAGNOSIS — Z046 Encounter for general psychiatric examination, requested by authority: Secondary | ICD-10-CM | POA: Diagnosis not present

## 2019-08-19 DIAGNOSIS — Z20822 Contact with and (suspected) exposure to covid-19: Secondary | ICD-10-CM | POA: Insufficient documentation

## 2019-08-19 DIAGNOSIS — F17210 Nicotine dependence, cigarettes, uncomplicated: Secondary | ICD-10-CM | POA: Diagnosis not present

## 2019-08-19 DIAGNOSIS — R451 Restlessness and agitation: Secondary | ICD-10-CM | POA: Insufficient documentation

## 2019-08-19 DIAGNOSIS — R4589 Other symptoms and signs involving emotional state: Secondary | ICD-10-CM | POA: Diagnosis present

## 2019-08-19 LAB — COMPREHENSIVE METABOLIC PANEL
ALT: 13 U/L (ref 0–44)
AST: 20 U/L (ref 15–41)
Albumin: 4.5 g/dL (ref 3.5–5.0)
Alkaline Phosphatase: 65 U/L (ref 38–126)
Anion gap: 12 (ref 5–15)
BUN: 14 mg/dL (ref 6–20)
CO2: 26 mmol/L (ref 22–32)
Calcium: 9.3 mg/dL (ref 8.9–10.3)
Chloride: 99 mmol/L (ref 98–111)
Creatinine, Ser: 1.07 mg/dL (ref 0.61–1.24)
GFR calc Af Amer: >60 mL/min (ref >60)
GFR calc non Af Amer: >60 mL/min (ref >60)
Glucose, Bld: 97 mg/dL (ref 70–99)
Potassium: 4.3 mmol/L (ref 3.5–5.1)
Sodium: 137 mmol/L (ref 135–145)
Total Bilirubin: 0.7 mg/dL (ref 0.3–1.2)
Total Protein: 8.1 g/dL (ref 6.5–8.1)

## 2019-08-19 LAB — CBC
HCT: 42 % (ref 39.0–52.0)
Hemoglobin: 14.1 g/dL (ref 13.0–17.0)
MCH: 32.4 pg (ref 26.0–34.0)
MCHC: 33.6 g/dL (ref 30.0–36.0)
MCV: 96.6 fL (ref 80.0–100.0)
Platelets: 261 10*3/uL (ref 150–400)
RBC: 4.35 MIL/uL (ref 4.22–5.81)
RDW: 13.6 % (ref 11.5–15.5)
WBC: 11.5 10*3/uL — ABNORMAL HIGH (ref 4.0–10.5)
nRBC: 0 % (ref 0.0–0.2)

## 2019-08-19 LAB — RAPID URINE DRUG SCREEN, HOSP PERFORMED
Amphetamines: NOT DETECTED
Barbiturates: NOT DETECTED
Benzodiazepines: NOT DETECTED
Cocaine: NOT DETECTED
Opiates: NOT DETECTED
Tetrahydrocannabinol: POSITIVE — AB

## 2019-08-19 LAB — SALICYLATE LEVEL: Salicylate Lvl: <7 mg/dL — ABNORMAL LOW (ref 7.0–30.0)

## 2019-08-19 LAB — ACETAMINOPHEN LEVEL: Acetaminophen (Tylenol), Serum: <10 ug/mL — ABNORMAL LOW (ref 10–30)

## 2019-08-19 LAB — ETHANOL: Alcohol, Ethyl (B): <10 mg/dL (ref <10)

## 2019-08-19 NOTE — ED Notes (Signed)
Pt resting quietly at this time. No voiced concerns.

## 2019-08-19 NOTE — ED Notes (Signed)
Pt verbalized to nurse that he is trying to make a statement and be heard. He wants Alfredia Client Cagle to know what she is doing is wrong, " She is trying to put unwanted drugs in my body". When nurse asked pt what unwanted drugs, pt stated " the vaccine , it is a drug and a vaccine but still a drug". 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 " Nurse asked pt if he took the vaccine, pt stated no. Nurse asked pt if he had any mental health diagnosis or history? Pt stated ,"no , not on any records maybe a scratch here or two. " " I know I'm IVC'd and I have to change clothes, draw blood and stuff." " do you have her number or email ? Probably not , none of [place] do."  Pt arrived with [employer] in handcuffs, ambulating to room. Pt belongings in locker.

## 2019-08-19 NOTE — BH Assessment (Signed)
Comprehensive Clinical Assessment (CCA) Note  08/19/2019 [facility] [id number] -Clinician reviewed note by Benjamin Captain, PA.;  [facility]. is a 49 y.o. male  brought in by [facility] deputies after calling 911 to report a crime because [facility] is forcing employees to take the COVID-19 Vaccine.  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 He states that because it was not considered a crime he told 911 he had a gun to his head.  He states that he had a "standoff" with police, but that he wished it had been much bigger, with helicopters and news crews. He states that "this is just the beginning of what I'm going to do." He has repeatedly demanded to meet with the CEO of [facility], [facility]. He states that he is not suicidal, but "if it takes blowing my head off to get their attention, we'll see what the future brings." He states that "more people are going to retaliate, more unstable people than me! I'm just the canary in the coal mine. You can't trample on people's basic rights!'   Clinician asked patient about his statement about having a gun to his head when he talked to 911. He said that he did that on the 2nd call because they did not pay attention to him when he called the first time.  Clinician asked if he was suicidal and he said, "not at all."  He denies any previous attempts and denies that he has any plan currently.  Patient denies any plan or intention to harm anyone else.  He denies any A/V hallucinations.  Patient is positive for THC.  He says he had not had any marijuana in 15 years and has used a little over the last two weeks.    Patient says he is wanting to draw attention to having to put a foreign substance in his body and having his employment imperiled if he does not do it.  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  He says that he  does not think that he should have to be forced to take a shot.  He does not want his basic human right to say "no I don't want this" to be taken away.  When asked why he chose to do this now he said that he saw on the news today that the Biden administration is requiring federal employees to have the vaccine.  When asked why he didn't speak up at any of the virtual town halls he said "they are going to do what they want anyway."  Pt did say that he "should not have gone about this the way I did."    Patient has good eye contact and is oriented x4.  Pt is not responding to internal stimuli.  Pt does talk loudly and shows poor insight into his actions.  He lacks good judgement and impulse control at this time.  He acknowledges that his actions have caused some stress for spouse.    Pt denies any depressive symptoms.  He has no previous inpatient or outpatient care.  He states "I'm not crazy, I just don't want my rights taken away."    -Clinician discussed patient care with Benjamin Conn, [facility] who recommended that patient be observed overnight at [facility] and seen by psychiatry in AM.  Clinician talked with Benjamin Captain,  PA who said that patient had been IVC'ed because of his actions of stating that he had a gun to his head and his subsequent statement about "blowing my head off to get their attention, we'll see what the future brings."  [facility] was fine with patient being observed overnight and being reviewed by psychiatry in AM.    Visit Diagnosis:      ICD-10-CM   1. Suicidal behavior without attempted self-injury  R45.89   2. Involuntary commitment  Z04.6       CCA Screening, Triage and Referral (STR)  Patient Reported Information How did you hear about [facility]? Other (Comment) [facility](Law enforcement)  Referral name: No data recorded Referral phone number: No data recorded  Whom do you see for routine medical problems? I don't have a doctor  Practice/Facility Name: No data recorded Practice/Facility  Phone Number: No data recorded Name of Contact: No data recorded Contact Number: No data recorded Contact Fax Number: No data recorded Prescriber Name: No data recorded Prescriber Address (if known): No data recorded  What Is the Reason for Your Visit/Call Today? Pt was brought to [facility] by law enforcement after calling 911 to say he wanted to report a crime. On the 2nd call to 911 he threatened to blow his brains out.  How Long Has This Been Causing You Problems? > than 6 months  What Do You Feel Would Help You the Most Today? Assessment Only   Have You Recently Been in Any Inpatient Treatment (Hill/Detox/Crisis Center/28-Day Program)? No  Name/Location of Program/Hill:No data recorded How Long Were You There? No data recorded When Were You Discharged? No data recorded  [facility] You [facility]? Yes  Who Do You [facility] at [facility]? ED visits   Have You Recently Had Any Thoughts About Hurting Yourself? Yes  Are You Planning to Commit Suicide/Harm Yourself At This time? No ("I have no desire to kill myself.")   Have you Recently Had Thoughts About Hurting Someone [facility]? No  Explanation: No data recorded  Have You Used Any Alcohol or Drugs in the Past 24 Hours? Yes  How Long Ago Did You Use Drugs or Alcohol? 0400  What Did You Use and How Much? I "smoked weed"  a few hours ago, 4-6 hours ago.   Do You Currently Have a Therapist/Psychiatrist? No  Name of Therapist/Psychiatrist: No data recorded  Have You Been Recently Discharged From Any Office Practice or Programs? No  Explanation of Discharge From Practice/Program: No data recorded    CCA Screening Triage Referral Assessment Type of Contact: Tele-Assessment  Is this Initial or Reassessment? Initial Assessment  Date Telepsych consult ordered in [facility]:  08/19/19  [facility]  [facility]   Patient Reported Information Reviewed? Yes  Patient Left Without  Being Seen? No data recorded Reason for Not Completing Assessment: No data recorded  Collateral Involvement: No data recorded  Does Patient Have a Court Appointed Legal Guardian? No data recorded Name and Contact of Legal Guardian: No data recorded If Minor and Not Living with Parent(s), Who has Custody? No data recorded Is CPS involved or ever been involved? No data recorded Is APS involved or ever been involved? No data recorded  Patient Determined To Be At Risk for Harm To Self or Others Based on Review of Patient Reported Information or Presenting Complaint? Yes, for Self-Harm  Method: No data recorded Availability of Means: No data recorded Intent: No data recorded Notification Required: No data  recorded Additional Information for Danger to Others Potential: No data recorded Additional Comments for Danger to Others Potential: No data recorded Are There Guns or Other Weapons in Your Home? No data recorded Types of Guns/Weapons: No data recorded Are These Weapons Safely Secured?                            No data recorded Who Could Verify You Are Able To Have These Secured: No data recorded Do You Have any Outstanding Charges, Pending Court Dates, Parole/Probation? No data recorded Contacted To Inform of Risk of Harm To Self or Others: Other: Comment (Pt threatened to harm himself.)   Location of Assessment: No data recorded  Does Patient Present under Involuntary Commitment? Yes (EDP initiated)  IVC Papers Initial File Date: 08/19/19   [place] of Residence: [place]   Patient Currently Receiving the Following Services: Not Receiving Services   Determination of Need: Emergent (2 hours)   Options For Referral: Inpatient Hospitalization (Pt on IVC.)     CCA Biopsychosocial  Intake/Chief Complaint:  CCA Intake With Chief Complaint CCA Part Two Date: 08/19/19 CCA Part Two Time: 2022 Chief Complaint/Presenting Problem: Pt had called 911 and told them there was a  crime being commited by the CEO of [employer] by requiring employees to take the COVID shot.  The 911 people told him that was not a crime.  He called back and told them that he had a gun to his head.  The [employer] came to his house and brought him to [facility].  Pt now says he does not want to kill himself. Patient's Currently Reported Symptoms/Problems: Pt denies any current SI, HI or A/V hallucinations. Individual's Strengths: Pt is a Charity fundraiser at [employer].  Ability to stand for his convictions. Individual's Abilities: Pt ia a Charity fundraiser. Type of Services Patient Feels Are Needed: Pt does not feel that he needs any services. Initial Clinical Notes/Concerns: Patient  Mental Hill Symptoms Depression:  Depression: None (Pt denies any depressive symptoms.)  Mania:  Mania: Overconfidence  Anxiety:   Anxiety: None  Psychosis:  Psychosis: None  Trauma:  Trauma: None  Obsessions:  Obsessions: None  Compulsions:  Compulsions: None  Inattention:  Inattention: None  Hyperactivity/Impulsivity:  Hyperactivity/Impulsivity: Blurts out answers  Oppositional/Defiant Behaviors:  Oppositional/Defiant Behaviors: None  Emotional Irregularity:  Emotional Irregularity: None  Other Mood/Personality Symptoms:      Mental Status Exam Appearance and self-care  Stature:  Stature: Average  Weight:  Weight: Average weight  Clothing:     Grooming:  Grooming: Normal  Cosmetic use:  Cosmetic Use: None  Posture/gait:     Motor activity:     Sensorium  Attention:  Attention: Normal  Concentration:  Concentration: Normal  Orientation:  Orientation: X5  Recall/memory:  Recall/Memory: Normal  Affect and Mood  Affect:  Affect: Anxious  Mood:  Mood: Angry  Relating  Eye contact:  Eye Contact: Normal  Facial expression:  Facial Expression: Tense  Attitude toward examiner:  Attitude Toward Examiner: Cooperative  Thought and Language  Speech flow: Speech Flow: Loud  Thought content:  Thought Content: Appropriate to Mood and  Circumstances  Preoccupation:  Preoccupations: None  Hallucinations:  Hallucinations: None  Organization:     Company secretary of Knowledge:  Fund of Knowledge: Average  Intelligence:  Intelligence: Average  Abstraction:  Abstraction: Normal  Judgement:  Judgement: Impaired  Reality Testing:  Reality Testing: Realistic  Insight:  Insight: Fair  Decision  Making:  Decision Making: Impulsive  Social Functioning  Social Maturity:  Social Maturity: Responsible  Social Judgement:     Stress  Stressors:     Coping Ability:     Skill Deficits:     Supports:        Religion:    Leisure/Recreation:    Exercise/Diet: Exercise/Diet Do You Have Any Trouble Sleeping?: No   CCA Employment/Education  Employment/Work Situation: Employment / Work Situation Employment situation: Employed Where is patient currently employed?: [employer] long has patient been employed?: 10 years at [employer] job has been impacted by current illness: Yes Describe how patient's job has been impacted: Pt may be unemployed after these events.  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 [employer] Did [place] Attend Graduate School?: No   CCA Family/Childhood History  Family and Relationship History: Family history Marital status: Married Does patient have children?: Yes  Childhood History:     Child/Adolescent Assessment:     CCA Substance Use  Alcohol/Drug Use: Alcohol / Drug Use Pain Medications: None Prescriptions: None Over the Counter: None History of alcohol / drug use?: Yes Withdrawal Symptoms: Other (Comment) Substance #1 Name of Substance 1: Marijuana 1 - Age of First Use: teens 1 - Amount (size/oz): Varies 1 - Frequency: Varies 1 - Duration: First use in 15 years was 2 weeks ago. 1 - Last Use / Amount: 07/30                        ASAM's:  Six Dimensions of Multidimensional Assessment  Dimension 1:  Acute Intoxication and/or Withdrawal Potential:      Dimension 2:  Biomedical Conditions and Complications:      Dimension 3:  Emotional, Behavioral, or Cognitive Conditions and Complications:     Dimension 4:  Readiness to Change:     Dimension 5:  Relapse, Continued use, or Continued Problem Potential:     Dimension 6:  Recovery/Living Environment:     ASAM Severity Score:    ASAM Recommended Level of Treatment:     Substance use Disorder (SUD)    Recommendations for Services/Supports/Treatments:    DSM5 Diagnoses: There are no problems to display for this patient.   Patient Centered Plan: Patient is on the following Treatment Plan(s):  Impulse Control   Referrals to Alternative Service(s): Referred to Alternative Service(s):   Place:   Date:   Time:    Referred to Alternative Service(s):   Place:   Date:   Time:    Referred to Alternative Service(s):   Place:   Date:   Time:    Referred to Alternative Service(s):   Place:   Date:   Time:     [place]

## 2019-08-19 NOTE — ED Notes (Signed)
IVC papers faxed to Magistrate 

## 2019-08-19 NOTE — ED Triage Notes (Signed)
States he is making a stand because of the mandatory covid shot

## 2019-08-19 NOTE — ED Notes (Signed)
TTS complete 

## 2019-08-19 NOTE — ED Notes (Signed)
TTS in progress 

## 2019-08-19 NOTE — ED Notes (Signed)
Pt's belongings placed in EMS locker room consisting of shoes, pants, shirt, socks, cell phone, and a silver cross chain.

## 2019-08-19 NOTE — ED Provider Notes (Signed)
[facility] EMERGENCY DEPARTMENT Provider Note   CSN: [id number] Arrival date & time: 08/19/19  1626     History Chief Complaint  Patient presents with  . V70.1    [facility]. is a 49 y.o. male  brought in by [facility] deputies after calling 911 to report a crime because [employer] is forcing employees to take the COVID-19 Vaccine.  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 He states that because it was not considered a crime he told 911 he had a gun to his head.  He states that he had a "standoff" with police, but that he wished it had been much bigger, with helicopters and news crews. He states that "this is just the beginning of what I'm going to do." He has repeatedly demanded to meet with the CEO of [employer], [name]. He states that he is not suicidal, but "if it takes blowing my head off to get their attention, we'll see what the future brings." He states that "more people are going to retaliate, more unstable people than me! I'm just the canary in the coal mine. You can't trample on people's basic rights!'  HPI     History reviewed. No pertinent past medical history.  There are no problems to display for this patient.   History reviewed. No pertinent surgical history.     Family History  Problem Relation Age of Onset  . Cancer Mother   . Heart attack Mother   . Hypertension Mother   . Cancer Father     Social History   Tobacco Use  . Smoking status: Current Every Day Smoker    Packs/day: 1.00    Types: Cigarettes  . Smokeless tobacco: Never Used  Substance Use Topics  . Alcohol use: Not Currently  . Drug use: Not Currently    Home Medications Prior to Admission medications   Medication Sig Start Date End Date Taking? Authorizing Provider  amitriptyline (ELAVIL) 50 MG tablet Take one po qhs Patient not taking: Reported on 08/19/2019 03/30/18   Bethann Berkshire, MD  ibuprofen (ADVIL) 800  MG tablet Take 1 tablet (800 mg total) by mouth 3 (three) times daily. Patient not taking: Reported on 08/19/2019 03/01/19   Durward Parcel, FNP  predniSONE (DELTASONE) 10 MG tablet Take 2 tablets (20 mg total) by mouth daily. Patient not taking: Reported on 08/19/2019 03/01/19   Durward Parcel, FNP    Allergies    Patient has no known allergies.  Review of Systems   Review of Systems Ten systems reviewed and are negative for acute change, except as noted in the HPI.   Physical Exam Updated Vital Signs BP (!) 152/101 (BP Location: Left Arm)   Pulse 98   Temp 99.2 F (37.3 C) (Oral)   Resp 18   Ht 5\' 10"  (1.778 m)   SpO2 98%   BMI 21.52 kg/m   Physical Exam Vitals and nursing note reviewed.  Constitutional:      General: He is not in acute distress.    Appearance: He is well-developed. He is not diaphoretic.  HENT:     Head: Normocephalic and atraumatic.  Eyes:     General: No scleral icterus.    Conjunctiva/sclera: Conjunctivae normal.  Cardiovascular:     Rate and Rhythm: Normal rate and regular rhythm.     Heart sounds: Normal heart sounds.  Pulmonary:     Effort: Pulmonary effort is  normal. No respiratory distress.     Breath sounds: Normal breath sounds.  Abdominal:     Palpations: Abdomen is soft.     Tenderness: There is no abdominal tenderness.  Musculoskeletal:     Cervical back: Normal range of motion and neck supple.  Skin:    General: Skin is warm and dry.  Neurological:     Mental Status: He is alert.  Psychiatric:        Mood and Affect: Mood normal.        Speech: Speech normal.        Behavior: Behavior is agitated. Behavior is cooperative.        Judgment: Judgment is impulsive and inappropriate.     ED Results / Procedures / Treatments   Labs (all labs ordered are listed, but only abnormal results are displayed) Labs Reviewed  SALICYLATE LEVEL - Abnormal; Notable for the following components:      Result Value   Salicylate Lvl <7.0  (*)    All other components within normal limits  ACETAMINOPHEN LEVEL - Abnormal; Notable for the following components:   Acetaminophen (Tylenol), Serum <10 (*)    All other components within normal limits  CBC - Abnormal; Notable for the following components:   WBC 11.5 (*)    All other components within normal limits  RAPID URINE DRUG SCREEN, HOSP PERFORMED - Abnormal; Notable for the following components:   Tetrahydrocannabinol POSITIVE (*)    All other components within normal limits  SARS CORONAVIRUS 2 BY RT PCR (HOSPITAL ORDER, PERFORMED IN [facility])  COMPREHENSIVE METABOLIC PANEL  ETHANOL    EKG None  Radiology No results found.  Procedures Procedures (including critical care time)  Medications Ordered in ED Medications - No data to display  ED Course  I have reviewed the triage vital signs and the nursing notes.  Pertinent labs & imaging results that were available during my care of the patient were reviewed by me and considered in my medical decision making (see chart for details).    MDM Rules/Calculators/A&P                          .Patient placed under involuntary commitment.  Labs are without acute abnormality.  Covid test pending. Awaiting TTS evaluation. Final Clinical Impression(s) / ED Diagnoses Final diagnoses:  Suicidal behavior without attempted self-injury  Involuntary commitment    Rx / DC Orders ED Discharge Orders    None       [name], PA-C 08/19/19 2104    Linwood Dibbles, MD 08/22/19 1053

## 2019-08-20 ENCOUNTER — Encounter (HOSPITAL_COMMUNITY): Payer: Self-pay | Admitting: Emergency Medicine

## 2019-08-20 DIAGNOSIS — F309 Manic episode, unspecified: Secondary | ICD-10-CM

## 2019-08-20 LAB — SARS CORONAVIRUS 2 BY RT PCR (HOSPITAL ORDER, PERFORMED IN ~~LOC~~ HOSPITAL LAB): SARS Coronavirus 2: NEGATIVE

## 2019-08-20 MED ORDER — HYDROXYZINE HCL 25 MG PO TABS: 25.0000 mg | ORAL_TABLET | Freq: Three times a day (TID) | ORAL | Status: DC | PRN

## 2019-08-20 MED ORDER — RISPERIDONE 1 MG PO TBDP
0.5000 mg | ORAL_TABLET | Freq: Every day | ORAL | Status: DC
  Administered 2019-08-20 – 2019-08-21 (×2): 0.5 mg via ORAL

## 2019-08-20 MED ORDER — RISPERIDONE 1 MG PO TBDP
1.0000 mg | ORAL_TABLET | Freq: Every day | ORAL | Status: DC
  Administered 2019-08-20: 1 mg via ORAL
  Filled 2019-08-20 (×3): qty 1

## 2019-08-20 NOTE — ED Notes (Signed)
Sheriiff Sam Page at pt bedside to speak with pt

## 2019-08-20 NOTE — ED Notes (Signed)
Covid spec to lab 

## 2019-08-20 NOTE — ED Notes (Signed)
Pt initially refused med stating "I've never taken this med, you are willing to give this to me not knowing what it will do?" when this RN stated that it had (again) recommended for pt, he reported, " I know I have no choice. 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 He then took the med -   Tim, [facility] in to speak with pt  [employer] outside of room   Pt continues to try to manipulate with demands  He is encouraged to stay in his room  There is no answer at any [facility] phone numbers when called for recommendations

## 2019-08-20 NOTE — ED Notes (Signed)
Dr Pilar Plate went to room to talk to patient per patient's request.

## 2019-08-20 NOTE — ED Notes (Signed)
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 Asked regarding second telepsych assessment and was informed that he has been recommended for continued inpt treatment.  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 Tim, [facility] called, security also called for ? Pt impulsivity and manipulative behaviors- ie demanding to see [facility], EDP, stating he is a CN as well as previously written behaviors.

## 2019-08-20 NOTE — Progress Notes (Addendum)
Patient meets criteria for inpatient treatment per Armandina Stammer NP. There are no available beds at [facility] currently. CSW faxed referrals to the following facilities for review:  [facility]  TTS will continue to seek bed placement.   Trula Slade, MSW, LCSW Clinical Social Worker 08/20/2019 3:30 PM

## 2019-08-20 NOTE — Progress Notes (Addendum)
Patient ID: Benjamin Hora., male   DOB: Jan 22, 1970, 49 y.o.   MRN: [id number].  This is a follow-up tele-psych evaluation for, this 49 year old Caucasian male, Benjamin Hill. who wastaken to the [facility] hospital ED by the [employer] after calling 911 to report a crime because Benjamin Hill is forcing employees to take the COVID-19 Vaccine. He apparently was holding a gun to his head, threatening suicide at the time. During this tele-psych assessment, Benjamin Hill reports,  "I have been in this hospital x 14 hrs now. I am ready to be discharged & I need to go home to my wife & daughter. I know I acted out, made a stupid mistake by threatening that I was going to commit suicide. I did what I did to get attention. I regretted how I did it. I was just trying to stand up for what I believe, I just chose the wrong way to do it. I just could not stand & watch innocent employees of [employer] lose their jobs because they refused to take the Covid vaccine they did not want to take. But, I'm good now. I'm healthy. I have no mental health issues. I'm in an excellent health. I have already quit my job with [employer]. I have decided to get another job where I will not be quired or forced to take the COVID Vaccine. During this time, Benjamin Hill presents upset. His speech is pressured. He seem restless & eager to leave the hospital. He currently seem a bit manic. This provider did ask for his patient's wife's phone # for a collateral information.  Patient however was very reluctant to allow/concent for this provider to contact his wife Benjamin Hill) for the collateral information. He states that he does not want his wife be stressed out any more than she already had. When he was informed that the decision to discharge or admit will not be be considered unless we talked to his wife. Patient eventually but reluctantly allowed [place] to speak to his wife. Upon reaching out to Benjamin Hill at [phone], she  reports that she was not home when Benjamin Hill made the threat of suicide, but the cops did inform her of what transcended. She says Benjamin Hill did have a hx of mental instability in 49 after their marital separation. At that time, he called 911 & asked how many Xanax tablets will it take to kill himself. He was treated for depression at the time & he sort counseling as well. Also, about 18 months ago, that [place] had, mental breakdown, but no hospitalization. And prior to this incident, Benjamin Hill states that [place] sounded stressed. She did not think that he is mentally okay at this time. She adds that during their phone conversation this morning, that Benjamin Hill sounded very upset. She is currently concerned about his mental health thinks, he will need further hospitalization. Benjamin Hill adamantly asked for her name to not be mentioned that he played any part in the decision to hospitalize him. At this time, will recommend this patient for inpatient hospitalization for mood stabilization treatments.  This case is discussed with Dr. Landry Mellow who agreed with the decision for the inpatient hospitalization.  Initiated: Risperdal 0.5 mg po daily for mood control.                 Risperdal 1 mg po Q hs for mood control.                 Vistaril 25  mg po tid prn for anxiety.                 Recommend inpatient hospitalization outside the [place] system.

## 2019-08-20 NOTE — ED Notes (Signed)
Pt ambulated to restroom. 

## 2019-08-20 NOTE — ED Notes (Signed)
Pt is on telephone   Asks this nurse if he is to be placed at [facility] facilities or is care sought at other places  He is assured that he is being followed by TTS who is seeking care at every available place for his care

## 2019-08-20 NOTE — ED Notes (Signed)
Meal provided 

## 2019-08-20 NOTE — ED Notes (Signed)
[facility] called via [employer] as requested by Thurston Hole to sit with the PT.

## 2019-08-20 NOTE — ED Notes (Signed)
Pt demands to speak again with TTS to release him from IVC "this mistake" He asks for and is given the phone  He is told that Jorja Loa, [facility] will come to speak with him, but that [facility] has final recommendation regarding his IVC and their recommendation is that he be inpatient  Pt then demands to speak with TTS again He is informed that he has had a repeat assessment and that I will request, but it is not a guarantee that he will be reassessed upon his request  [facility] called due to pt knowledge of nursing routine and his standing at the door glaring at staff -   Jorja Loa, [facility] in to speak with

## 2019-08-21 MED ORDER — RISPERIDONE 1 MG PO TBDP
1.0000 mg | ORAL_TABLET | Freq: Every day | ORAL | Status: DC
  Administered 2019-08-22: 1 mg via ORAL
  Filled 2019-08-21: qty 1

## 2019-08-21 MED ORDER — DIVALPROEX SODIUM 250 MG PO DR TAB
500.0000 mg | DELAYED_RELEASE_TABLET | Freq: Every day | ORAL | Status: DC
  Administered 2019-08-21: 500 mg via ORAL
  Filled 2019-08-21: qty 2

## 2019-08-21 MED ORDER — BENZTROPINE MESYLATE 1 MG PO TABS
1.0000 mg | ORAL_TABLET | Freq: Every day | ORAL | Status: DC
  Administered 2019-08-21: 1 mg via ORAL
  Filled 2019-08-21: qty 1

## 2019-08-21 MED ORDER — RISPERIDONE 1 MG PO TBDP
2.0000 mg | ORAL_TABLET | Freq: Every day | ORAL | Status: DC
  Administered 2019-08-21: 2 mg via ORAL
  Filled 2019-08-21: qty 2

## 2019-08-21 NOTE — ED Notes (Signed)
Pt asking why his medication was increased- informed pt that I would look over the [facility] counselor's notes and try to give some insight on why meds were increased.

## 2019-08-21 NOTE — ED Notes (Signed)
The patient is apologetic this am and states "I'm so sorry if I was disrespectful to any of the staff the past few days. I feel so much better after being here and am no longer Suicidal. I would like to speak to the TTS department today if possible."

## 2019-08-21 NOTE — ED Notes (Addendum)
Called to room, pt is asking to be discharged.  Informed that he is not up for discharged, plan is to have Psychiatry re-assess.

## 2019-08-21 NOTE — Progress Notes (Signed)
New medication orders received from NP and entered.  Information provided to staff caring for patient.

## 2019-08-21 NOTE — ED Notes (Addendum)
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  Pt ask discharged pt, "have you heard?  Discharged pt says, "no."  Pt then says, "Well, watch me on Facebook."  Verlon Au, Charity fundraiser and [employer] Museum/gallery exhibitions officer) informed.

## 2019-08-21 NOTE — ED Notes (Signed)
Behavioral health called [phone], no answer.

## 2019-08-21 NOTE — ED Notes (Signed)
Pt made 2 phone calls to his wife this morning and became agitated. This RN verbally de-escalated the Pt and Pt calm at this time.

## 2019-08-21 NOTE — ED Notes (Signed)
Deanna to consult Psychiatry for re-assessment

## 2019-08-21 NOTE — ED Notes (Signed)
Pt given meal tray for dinner

## 2019-08-21 NOTE — ED Provider Notes (Signed)
Emergency Medicine Observation Re-evaluation Note  Kalden Wanke. is a 49 y.o. male, seen on rounds today.  Pt initially presented to the ED for complaints of V70.1 Currently, the patient is calm and alert, requesting TTS re-eval.   Physical Exam  BP (!) 112/64 (BP Location: Left Arm)   Pulse 56   Temp 98.1 F (36.7 C) (Oral)   Resp 16   Ht 1.778 m (5\' 10" )   SpO2 94%   BMI 21.52 kg/m  Physical Exam  Psych: pt alert, content. Period of agitation earlier this AM, but now calm and alert.  ED Course / MDM  I have reviewed the labs performed to date as well as medications administered while in observation.  Recent changes in the last 24 hours include BH team reassessment with adjustment of medications yesterday.   Plan  Current plan is for [facility] team reassessment, and inpatient psychiatric placement.      [facility], MD 08/21/19 [phone]

## 2019-08-21 NOTE — ED Notes (Signed)
TTS in process at this time  

## 2019-08-21 NOTE — ED Notes (Signed)
While sitting with Mr. 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 He stated to the other man after a few words "you can find me on face book, the man stated "alright buddy" and left the ED.

## 2019-08-21 NOTE — ED Notes (Addendum)
Spoke with Hilda Lias (supervisor for [facility]), plan to review chart and return call.

## 2019-08-21 NOTE — BHH Counselor (Signed)
   Patient expressed apologetic statements stating he allowed his emotions get the best of him and was not thinking logistically or using reason. When asked has he every experienced an epsoide similar to this in the past. Patient stated two years ago he was dealing with depression and guilt, had therapy and medication management. Denied behavioral outburst similar to the one that placed him in the hospital. Patient consent to TTS contacting his wife Lawson Fiscal or his 47 year old son who he reports lives in the home.     Collateral:  [phone] Lawson Fiscal (10:36am) - Report husband has been stressed out on getting the Covid vaccination. Report he knew that this would be coming eventually. Report she did not think her husband would get this stressed out and act out like he did. Report there are guns in the home. Report she can remove the guns from the house, place them in the barn and hide the key. Report this can be done before he is released from the hospital.  Report she has not concern for her safety or safety of her their son who lives in the home.   Disposition: Armandina Stammer, NP continue to recommend inpatient treatment

## 2019-08-22 ENCOUNTER — Encounter (HOSPITAL_COMMUNITY): Payer: Self-pay | Admitting: Registered Nurse

## 2019-08-22 MED ORDER — DIVALPROEX SODIUM 500 MG PO DR TAB: 500.0000 mg | DELAYED_RELEASE_TABLET | Freq: Every day | ORAL | 0 refills | Status: AC

## 2019-08-22 NOTE — ED Notes (Signed)
Pt talking to wife on the phone  

## 2019-08-22 NOTE — ED Notes (Signed)
Pt received a phn call. Pt is in bed watching tv and calm.

## 2019-08-22 NOTE — BH Assessment (Addendum)
Phone conversation with pt's wife, Benjamin Hill, this afternoon. She relays that pt  "definitely realizes he did not take the best approach".  Vernona Rieger, again, states she is comfortable and safe with pt returning home. She was asked by this Clinical research associate to make the guns less accessible than keeping them locked in their barn. Vernona Rieger states she understands and can contact neighbors who are aware of the situation and they will be able to keep the guns for a while.   Ms. Gaffey supports pt's decision to follow up with outpatient treatment at [facility]. She was also advised how to initiate an IVC through magistrate if she became concerned for pt's safety to self or others and to call 911 if ever there is immediate concern for danger.  Dr. Alyse Low, MD recommends psychiatric clearance, and follow up with outpatient treatment at [facility].  16:04 [facility] provider notified of [facility] disposition recommendation

## 2019-08-22 NOTE — ED Notes (Signed)
Notified Kwesi, Engineer, materials at [employer], of pt disposition. Kwesi reported would contact Lenard Forth, Security SW.

## 2019-08-22 NOTE — ED Provider Notes (Signed)
Emergency Medicine Observation Re-evaluation Note  Benjamin Hill. is a 49 y.o. male, seen on rounds today.  Pt initially presented to the ED for complaints of V70.1 Currently, the patient is .  Physical Exam  BP (!) 117/86 (BP Location: Left Arm)   Pulse 61   Temp 98.4 F (36.9 C) (Oral)   Resp 20   Ht 5\' 10"  (1.778 m)   SpO2 96%   BMI 21.52 kg/m  Physical Exam  ED Course / MDM  EKG:    I have reviewed the labs performed to date as well as medications administered while in observation.  Recent changes in the last 24 hours include med adjustments. Plan  Current plan is for inpatient bed search. Patient is under full IVC at this time.   , MD 08/22/19 [phone]

## 2019-08-22 NOTE — Discharge Instructions (Addendum)
Go to [facility] services tomorrow to discuss ongoing management.  If you have thoughts of hurting herself, hurting other people, please return to ER for reassessment.  Please take depakote as prescribed. You need a level check on 8/5 or 8/6.

## 2019-08-22 NOTE — ED Notes (Signed)
All belongings from locker returned to pt and his response "cool". Wife called for a ride home.

## 2019-08-22 NOTE — ED Provider Notes (Addendum)
Emergency Medicine Observation Re-evaluation Note  Benjamin Hill. is a 49 y.o. male.  Pt initially presented to the ED for complaints of V70.1 Currently, the patient is sitting up in bed, pleasant.  Physical Exam  BP (!) 127/99 (BP Location: Left Arm)   Pulse 72   Temp 98.1 F (36.7 C) (Oral)   Resp 19   Ht 5\' 10"  (1.778 m)   SpO2 99%   BMI 21.52 kg/m  Physical Exam Vitals and nursing note reviewed.  Constitutional:      General: He is not in acute distress.    Appearance: Normal appearance. He is not ill-appearing.  HENT:     Head: Normocephalic.  Cardiovascular:     Rate and Rhythm: Normal rate.     Pulses: Normal pulses.  Pulmonary:     Effort: Pulmonary effort is normal. No respiratory distress.  Musculoskeletal:        General: No deformity.  Neurological:     General: No focal deficit present.     Mental Status: He is alert and oriented to person, place, and time.  Psychiatric:        Mood and Affect: Mood normal.        Behavior: Behavior normal.        Thought Content: Thought content normal.        Judgment: Judgment normal.     ED Course / MDM  EKG:    I have reviewed the labs performed to date as well as medications administered while in observation.   Recent changes in the last 24 hours include psych has cleared.  I was contacted by behavioral health team, Dr. has recommended discharge and outpatient treatment at [facility] and patient has been psych cleared.  Recommended follow-up with [facility].  I reviewed this recommendation with our RN staff, they report patient has been appropriate and has not had any ongoing SI throughout the day today.  Evaluated patient, he is currently calm, cooperative, demonstrates insight into his current condition and believe patient is able to contract for safety.  Psychiatry has reviewed with wife who is comfortable and safe with patient returning home.  Completed paperwork to rescind IVC.  Plan  Current plan is  for discharge. Patient is no longer under full IVC at this time.  Psych recommends starting Depakote 500mg  daily, needs level checked 8/5 or 8/6    [facility], MD 08/22/19 [phone]

## 2019-08-22 NOTE — BH Assessment (Signed)
Reassessment Note: Pt presents calm, logical and eager to discuss discharge. He states he is missing his home (wife, daughter and farm) terribly. Pt states he knows he messed up and "just wanted attention". 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 He also states "I did the wrong thing for the right reason". Pt denies current SI. He denies past suicide attempts. Pt denies HI and reports he has never experienced AVH. Pt states he is "absolutely" willing to engage in outpatient counseling for follow up treatment when discharged from hospital. He participated in counseling a few years ago and found it helpful.

## 2021-08-09 IMAGING — DX DG SHOULDER 2+V*L*
3 series · 3 of 3 positions shown · non-contrast
Comparison: None.

CLINICAL DATA: 48-year-old male with sudden onset left shoulder
pain while lifting heavy furniture or earlier today. History of
prior acromioclavicular joint injury 20 years ago.

EXAM:
LEFT SHOULDER - 2+ VIEW

[shoulder internal rotation ap]
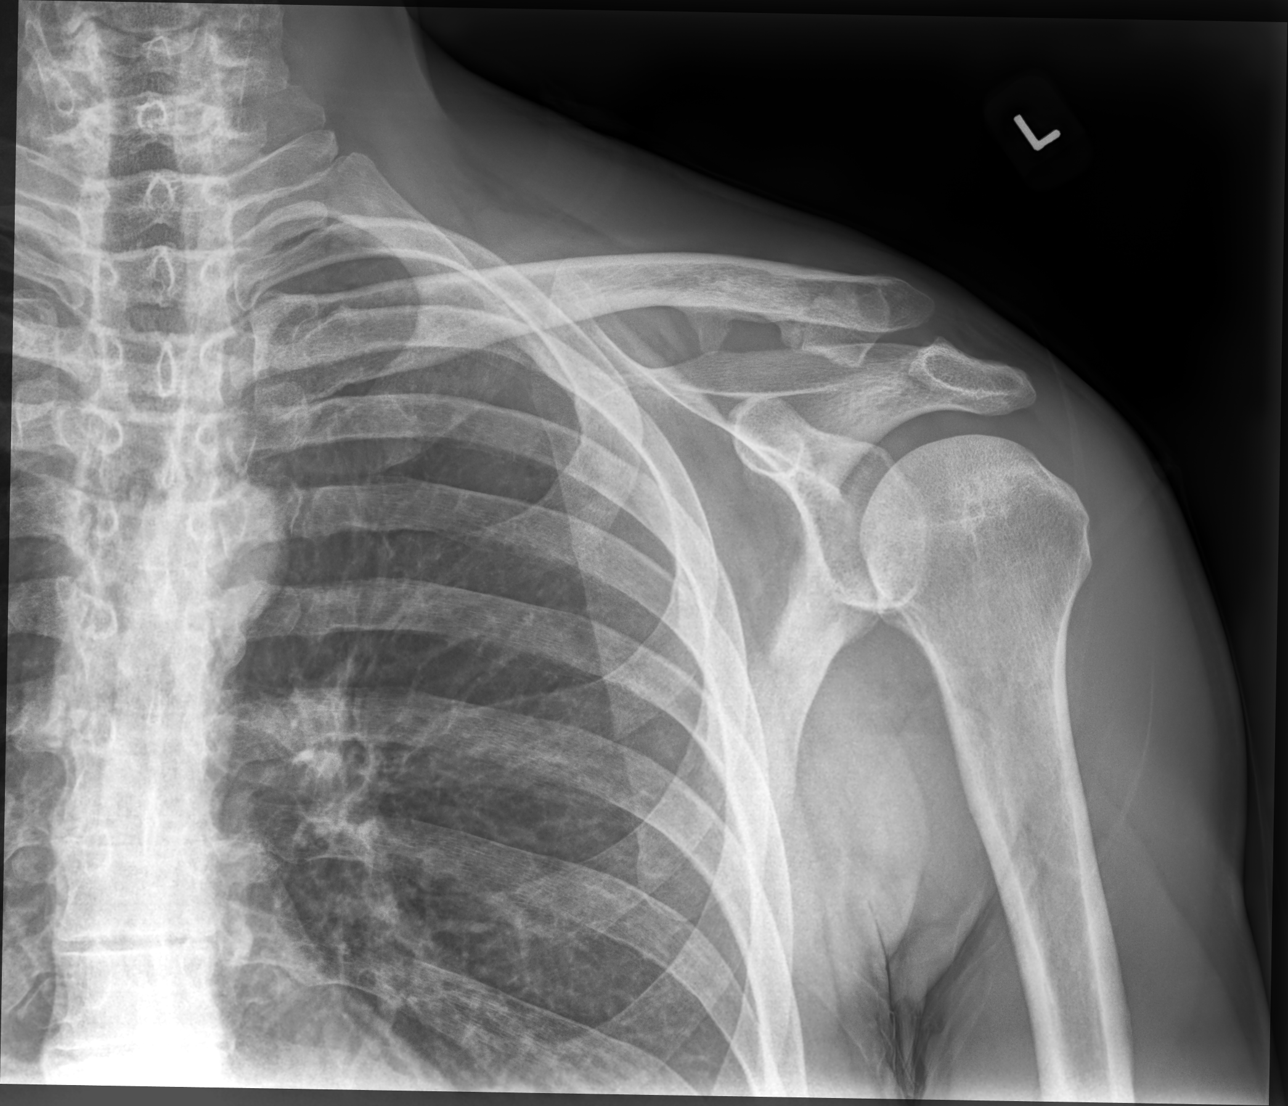

[shoulder external rotation ap]
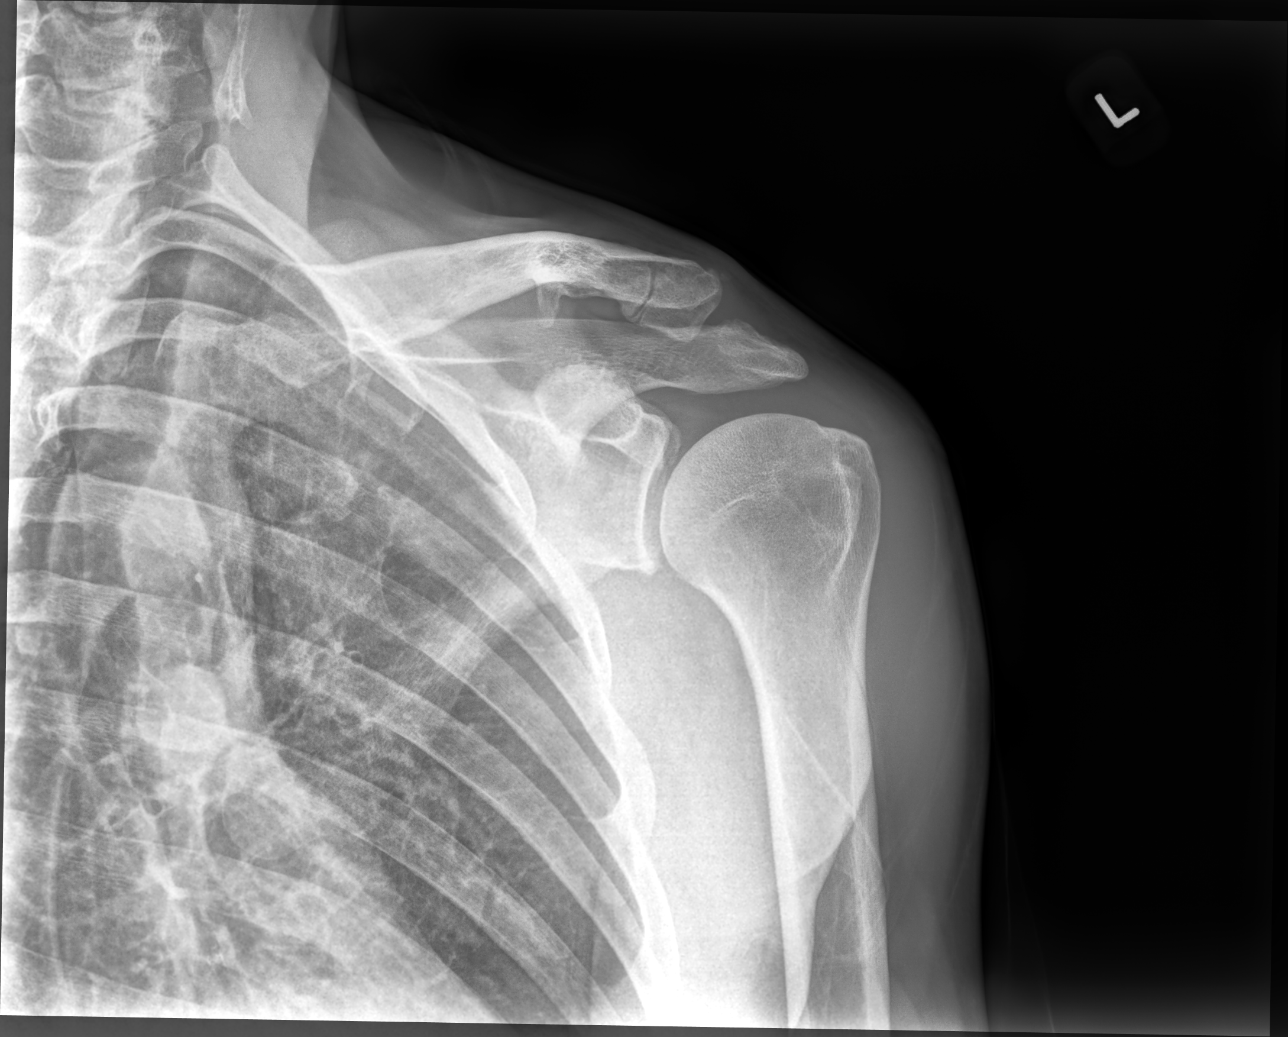

[shoulder (y view) lat]
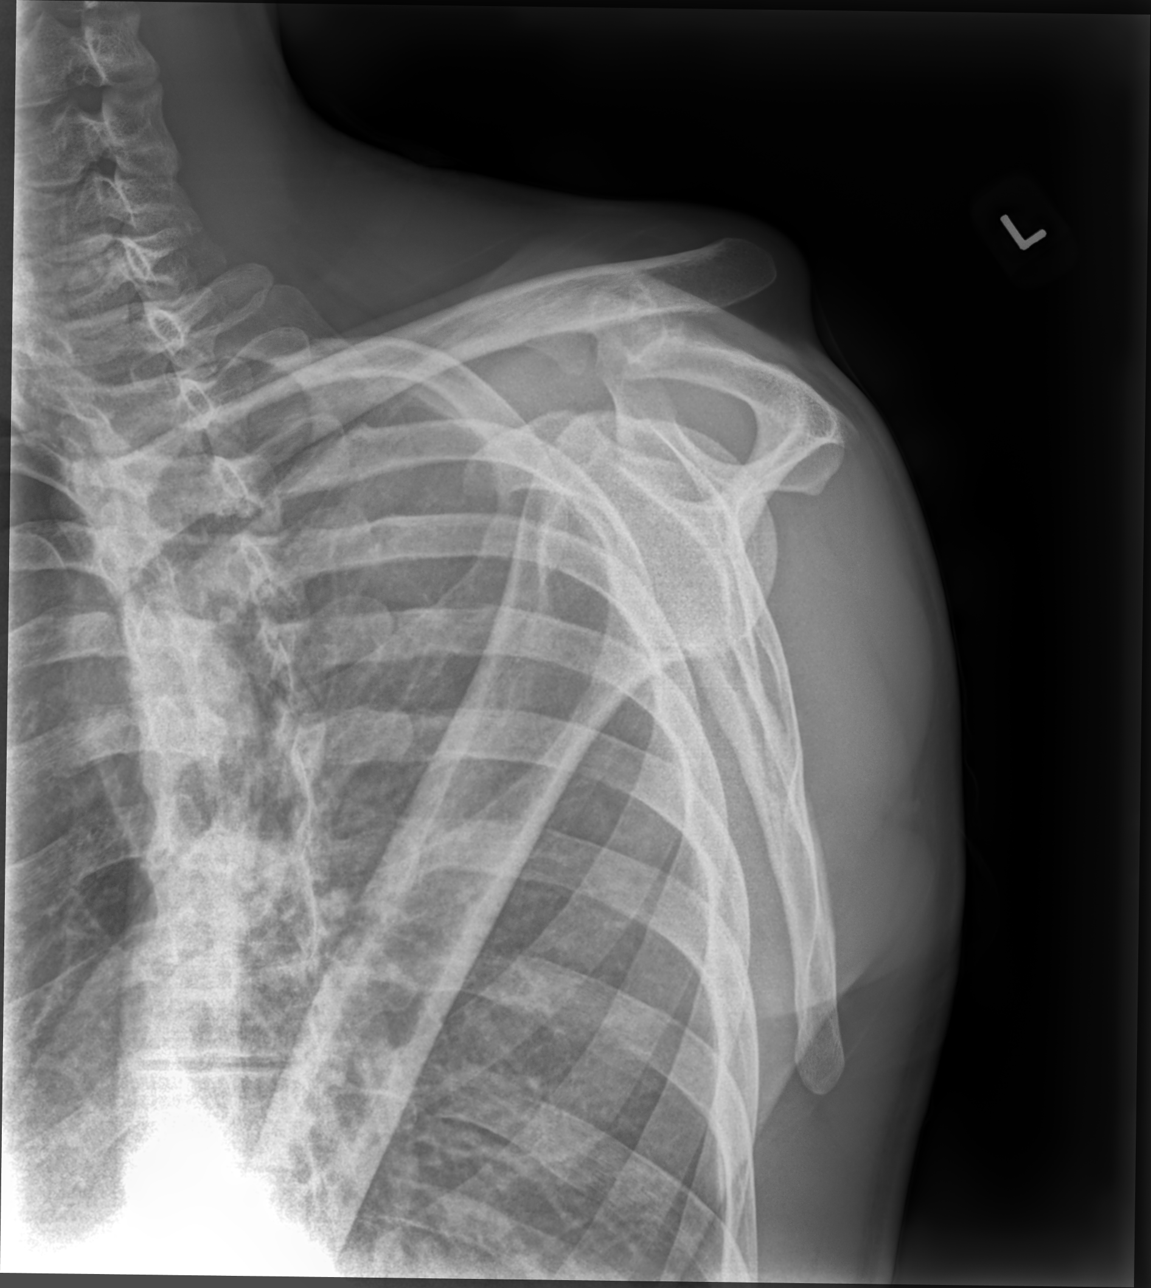

[3 of 3 positions shown; findings below may reference images not displayed]

FINDINGS: Cephalad displacement of the distal clavicle with respect to the
acromion process. The distal clavicle is displaced superiorly by
approximately 1 cm. There is evidence of a soft tissue contour
abnormality on the scapular Y-view. Additionally, chronic changes
are evident inferior to the distal clavicular head and in the region
of the insertion of the coracoclavicular ligament suggesting prior
grade 3 acromioclavicular joint injury. No acute fracture evident on
today's examination.
IMPRESSION: Cephalad displacement of the distal clavicle with respect to the
acromion process suggests acute on chronic acromioclavicular joint
injury.
